# Patient Record
Sex: Male | Born: 1965 | Race: Black or African American | Hispanic: No | Marital: Married | Smoking: Current every day smoker
Health system: Southern US, Community
[De-identification: ages and names within clinical notes are randomized; demographics above are authoritative.]

## PROBLEM LIST (undated history)

## (undated) DIAGNOSIS — M1990 Unspecified osteoarthritis, unspecified site: Secondary | ICD-10-CM

## (undated) DIAGNOSIS — Z720 Tobacco use: Secondary | ICD-10-CM

## (undated) DIAGNOSIS — L309 Dermatitis, unspecified: Secondary | ICD-10-CM

## (undated) DIAGNOSIS — E785 Hyperlipidemia, unspecified: Secondary | ICD-10-CM

## (undated) DIAGNOSIS — L409 Psoriasis, unspecified: Secondary | ICD-10-CM

## (undated) HISTORY — DX: Hyperlipidemia, unspecified: E78.5

## (undated) HISTORY — DX: Unspecified osteoarthritis, unspecified site: M19.90

## (undated) HISTORY — DX: Psoriasis, unspecified: L40.9

## (undated) HISTORY — DX: Tobacco use: Z72.0

---

## 2000-03-19 ENCOUNTER — Emergency Department (HOSPITAL_COMMUNITY): Admission: EM | Admit: 2000-03-19 | Discharge: 2000-03-19 | Payer: Self-pay | Admitting: Emergency Medicine

## 2000-03-21 ENCOUNTER — Emergency Department (HOSPITAL_COMMUNITY): Admission: EM | Admit: 2000-03-21 | Discharge: 2000-03-21 | Payer: Self-pay | Admitting: Emergency Medicine

## 2000-11-28 ENCOUNTER — Emergency Department (HOSPITAL_COMMUNITY): Admission: EM | Admit: 2000-11-28 | Discharge: 2000-11-28 | Payer: Self-pay | Admitting: Emergency Medicine

## 2000-11-28 ENCOUNTER — Encounter: Payer: Self-pay | Admitting: Emergency Medicine

## 2000-12-22 ENCOUNTER — Emergency Department (HOSPITAL_COMMUNITY): Admission: EM | Admit: 2000-12-22 | Discharge: 2000-12-22 | Payer: Self-pay | Admitting: Emergency Medicine

## 2007-06-12 ENCOUNTER — Ambulatory Visit: Payer: Self-pay | Admitting: Internal Medicine

## 2007-06-12 DIAGNOSIS — L408 Other psoriasis: Secondary | ICD-10-CM | POA: Insufficient documentation

## 2007-06-12 LAB — CONVERTED CEMR LAB
Bilirubin Urine: NEGATIVE
Glucose, Urine, Semiquant: 100
Nitrite: NEGATIVE
Protein, U semiquant: NEGATIVE
Specific Gravity, Urine: 1.015
Urobilinogen, UA: 0.2
WBC Urine, dipstick: NEGATIVE
pH: 6

## 2007-06-17 ENCOUNTER — Telehealth: Payer: Self-pay | Admitting: Internal Medicine

## 2007-06-22 LAB — CONVERTED CEMR LAB
ALT: 26 units/L (ref 0–53)
AST: 24 units/L (ref 0–37)
Albumin: 4.1 g/dL (ref 3.5–5.2)
Alkaline Phosphatase: 59 units/L (ref 39–117)
BUN: 10 mg/dL (ref 6–23)
Basophils Absolute: 0 10*3/uL (ref 0.0–0.1)
Basophils Relative: 0.3 % (ref 0.0–1.0)
Bilirubin, Direct: 0.2 mg/dL (ref 0.0–0.3)
CO2: 29 meq/L (ref 19–32)
Calcium: 9.7 mg/dL (ref 8.4–10.5)
Chloride: 102 meq/L (ref 96–112)
Cholesterol: 260 mg/dL (ref 0–200)
Creatinine, Ser: 1.1 mg/dL (ref 0.4–1.5)
Direct LDL: 189.6 mg/dL
Eosinophils Absolute: 0.1 10*3/uL (ref 0.0–0.6)
Eosinophils Relative: 1.7 % (ref 0.0–5.0)
GFR calc Af Amer: 95 mL/min
GFR calc non Af Amer: 79 mL/min
Glucose, Bld: 92 mg/dL (ref 70–99)
HCT: 44 % (ref 39.0–52.0)
HDL: 54.5 mg/dL (ref >39.0)
Hemoglobin: 14.4 g/dL (ref 13.0–17.0)
Lymphocytes Relative: 37.7 % (ref 12.0–46.0)
MCHC: 32.7 g/dL (ref 30.0–36.0)
MCV: 82.9 fL (ref 78.0–100.0)
Monocytes Absolute: 0.7 10*3/uL (ref 0.2–0.7)
Monocytes Relative: 8.4 % (ref 3.0–11.0)
Neutro Abs: 4.7 10*3/uL (ref 1.4–7.7)
Neutrophils Relative %: 51.9 % (ref 43.0–77.0)
PSA: 0.56 ng/mL (ref 0.10–4.00)
Platelets: 185 10*3/uL (ref 150–400)
Potassium: 3.9 meq/L (ref 3.5–5.1)
RBC: 5.3 M/uL (ref 4.22–5.81)
RDW: 14 % (ref 11.5–14.6)
Sodium: 138 meq/L (ref 135–145)
TSH: 2.2 microintl units/mL (ref 0.35–5.50)
Total Bilirubin: 1.1 mg/dL (ref 0.3–1.2)
Total CHOL/HDL Ratio: 4.8
Total Protein: 7 g/dL (ref 6.0–8.3)
Triglycerides: 52 mg/dL (ref 0–149)
VLDL: 10 mg/dL (ref 0–40)
WBC: 8.8 10*3/uL (ref 4.5–10.5)

## 2007-06-23 ENCOUNTER — Telehealth: Payer: Self-pay | Admitting: Internal Medicine

## 2007-07-20 ENCOUNTER — Ambulatory Visit: Payer: Self-pay | Admitting: Internal Medicine

## 2007-07-20 LAB — CONVERTED CEMR LAB
ALT: 28 units/L (ref 0–53)
AST: 26 units/L (ref 0–37)
Albumin: 4.2 g/dL (ref 3.5–5.2)
Alkaline Phosphatase: 68 units/L (ref 39–117)
BUN: 8 mg/dL (ref 6–23)
Basophils Absolute: 0.1 10*3/uL (ref 0.0–0.1)
Basophils Relative: 0.7 % (ref 0.0–1.0)
Bilirubin, Direct: 0.2 mg/dL (ref 0.0–0.3)
CO2: 27 meq/L (ref 19–32)
Calcium: 9.6 mg/dL (ref 8.4–10.5)
Chloride: 105 meq/L (ref 96–112)
Creatinine, Ser: 1.1 mg/dL (ref 0.4–1.5)
Eosinophils Absolute: 0.1 10*3/uL (ref 0.0–0.6)
Eosinophils Relative: 1.4 % (ref 0.0–5.0)
GFR calc Af Amer: 95 mL/min
GFR calc non Af Amer: 79 mL/min
Glucose, Bld: 102 mg/dL — ABNORMAL HIGH (ref 70–99)
HCT: 43.3 % (ref 39.0–52.0)
Hemoglobin: 14.2 g/dL (ref 13.0–17.0)
Lymphocytes Relative: 33.6 % (ref 12.0–46.0)
MCHC: 32.8 g/dL (ref 30.0–36.0)
MCV: 83.2 fL (ref 78.0–100.0)
Monocytes Absolute: 0.7 10*3/uL (ref 0.2–0.7)
Monocytes Relative: 10 % (ref 3.0–11.0)
Neutro Abs: 4 10*3/uL (ref 1.4–7.7)
Neutrophils Relative %: 54.3 % (ref 43.0–77.0)
Platelets: 178 10*3/uL (ref 150–400)
Potassium: 4.3 meq/L (ref 3.5–5.1)
RBC: 5.2 M/uL (ref 4.22–5.81)
RDW: 13.9 % (ref 11.5–14.6)
Sodium: 140 meq/L (ref 135–145)
Total Bilirubin: 0.9 mg/dL (ref 0.3–1.2)
Total Protein: 7.3 g/dL (ref 6.0–8.3)
WBC: 7.4 10*3/uL (ref 4.5–10.5)

## 2008-01-22 ENCOUNTER — Telehealth: Payer: Self-pay | Admitting: Internal Medicine

## 2010-01-09 ENCOUNTER — Ambulatory Visit: Payer: Self-pay | Admitting: Internal Medicine

## 2010-01-09 DIAGNOSIS — L0291 Cutaneous abscess, unspecified: Secondary | ICD-10-CM | POA: Insufficient documentation

## 2010-01-09 DIAGNOSIS — F17200 Nicotine dependence, unspecified, uncomplicated: Secondary | ICD-10-CM | POA: Insufficient documentation

## 2010-01-09 DIAGNOSIS — L0390 Cellulitis, unspecified: Secondary | ICD-10-CM

## 2010-01-10 LAB — CONVERTED CEMR LAB
ALT: 44 units/L (ref 0–53)
AST: 37 units/L (ref 0–37)
Albumin: 4.2 g/dL (ref 3.5–5.2)
Alkaline Phosphatase: 76 units/L (ref 39–117)
BUN: 9 mg/dL (ref 6–23)
Basophils Absolute: 0 10*3/uL (ref 0.0–0.1)
Basophils Relative: 0.5 % (ref 0.0–3.0)
Bilirubin, Direct: 0.1 mg/dL (ref 0.0–0.3)
CO2: 26 meq/L (ref 19–32)
Calcium: 9.6 mg/dL (ref 8.4–10.5)
Chloride: 105 meq/L (ref 96–112)
Cholesterol: 236 mg/dL — ABNORMAL HIGH (ref 0–200)
Creatinine, Ser: 1.1 mg/dL (ref 0.4–1.5)
Direct LDL: 187.9 mg/dL
Eosinophils Absolute: 0.2 10*3/uL (ref 0.0–0.7)
Eosinophils Relative: 2.4 % (ref 0.0–5.0)
GFR calc non Af Amer: 94.8 mL/min (ref >60)
Glucose, Bld: 79 mg/dL (ref 70–99)
HCT: 45.4 % (ref 39.0–52.0)
HDL: 42.7 mg/dL (ref >39.00)
Hemoglobin: 15.1 g/dL (ref 13.0–17.0)
Lymphocytes Relative: 31.8 % (ref 12.0–46.0)
Lymphs Abs: 2.4 10*3/uL (ref 0.7–4.0)
MCHC: 33.3 g/dL (ref 30.0–36.0)
MCV: 83.5 fL (ref 78.0–100.0)
Monocytes Absolute: 0.7 10*3/uL (ref 0.1–1.0)
Monocytes Relative: 9.5 % (ref 3.0–12.0)
Neutro Abs: 4.3 10*3/uL (ref 1.4–7.7)
Neutrophils Relative %: 55.8 % (ref 43.0–77.0)
Platelets: 185 10*3/uL (ref 150.0–400.0)
Potassium: 4.5 meq/L (ref 3.5–5.1)
RBC: 5.44 M/uL (ref 4.22–5.81)
RDW: 14.9 % — ABNORMAL HIGH (ref 11.5–14.6)
Sodium: 140 meq/L (ref 135–145)
TSH: 1.12 microintl units/mL (ref 0.35–5.50)
Total Bilirubin: 0.8 mg/dL (ref 0.3–1.2)
Total CHOL/HDL Ratio: 6
Total Protein: 7.3 g/dL (ref 6.0–8.3)
Triglycerides: 55 mg/dL (ref 0.0–149.0)
VLDL: 11 mg/dL (ref 0.0–40.0)
WBC: 7.7 10*3/uL (ref 4.5–10.5)

## 2010-06-14 NOTE — Assessment & Plan Note (Signed)
Summary: CPX/PT WILL COME IN FASTING/NJR   Vital Signs:  Patient profile:   45 year old male Height:      71.75 inches Weight:      200 pounds BMI:     27.41 Pulse rate:   72 / minute Pulse rhythm:   regular Resp:     12 per minute BP sitting:   114 / 70  (left arm) Cuff size:   regular  Vitals Entered By: Gladis Riffle, RN (January 09, 2010 10:10 AM)  Nutrition Counseling: Patient's BMI is greater than 25 and therefore counseled on weight management options. CC: cpx, fasting Is Patient Diabetic? No   CC:  cpx and fasting.  History of Present Illness: cpx  psoriasis---sees dermatology---taking prescription creams, previously on embrel---stopped 8 months ago due to expense  has noted 3 weeks of enlarged lymph nodes in groin---no known infections. no fever, chills, sweats  has noted a lesion on leg---left thigh---duration several months  All other systems reviewed and were negative     Preventive Screening-Counseling & Management  Alcohol-Tobacco     Alcohol drinks/day: 2     Smoking Status: current     Smoking Cessation Counseling: yes     Packs/Day: 0.75  Current Problems (verified): 1)  Preventive Health Care  (ICD-V70.0) 2)  Psoriasis  (ICD-696.1)  Current Medications (verified): 1)  No Medications  Allergies (verified): No Known Drug Allergies  Past History:  Past Medical History: Last updated: 06/12/2007 psoriasis  Past Surgical History: Last updated: 06/12/2007 Denies surgical history  Family History: Last updated: 06/12/2007 father and mother alive and well  Social History: Last updated: 06/12/2007 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 HEENT exam atraumatic normocephalic symmetric her muscles are intact. Oropharynx is moist. Dentition is poor and he has multiple absent teeth. Neck is supple without lymphadenopathy, Kathreen Cornfield, jugular venous distention or carotid bruits. Chest is clear auscultation without increased work of breathing. Cardiac exam S1 and S2 are normal without murmurs or gallops. Abdominal exam active bowel sounds, soft, nontender there's no post ligamentally. Dermatologic exam he is normal plaques on both lower extremities. It is slightly raised and scaly.  Patient has multiple small boils in the right inguinal area. One or 2 in the left inguinal area. These are somewhat raised, tender, erythematous. Neurologic exam is alert and oriented. Motor and sensory deficits are not observed. Gait is normal.   Impression & Recommendations:  Problem # 1:  PREVENTIVE HEALTH CARE (ICD-V70.0) health maintenance issues are up-to-date. Advised regular exercise. He needs routine dental care. He is considering having all of his teeth extracted and getting dentures. He currently is on amoxicillin. Orders: UA Dipstick w/o Micro (automated)  (81003) Venipuncture ([address]109) TLB-Lipid Panel (80061-LIPID) TLB-BMP (Basic Metabolic Panel-BMET) (80048-METABOL) TLB-CBC Platelet - w/Differential (85025-CBCD) TLB-Hepatic/Liver Function Pnl (80076-HEPATIC) TLB-TSH (Thyroid Stimulating Hormone) (84443-TSH) Specimen Handling ([address])  Problem # 2:  TOBACCO USE (ICD-305.1)  Encouraged smoking cessation and discussed different methods for smoking cessation.   Problem # 3:  PSORIASIS (ICD-696.1) has significant plaque psoriasis on his lower extremities. He will followup with dermatology.  Problem # 4:  CELLULITIS (ICD-682.9)  folliculitis d/c amoxil add doxyciycline  His updated medication list for this problem includes:    Doxycycline Hyclate  100 Mg Caps (Doxycycline hyclate) .Marland Kitchen... Take 1 tab twice a day  Complete  Medication List: 1)  Doxycycline Hyclate 100 Mg Caps (Doxycycline hyclate) .... Take 1 tab twice a day =  Patient Instructions: 1)  Call me in two weeks if the "lymph nodes" are not better---I suspect these are small boils related to ingrown hair follicles Prescriptions: DOXYCYCLINE HYCLATE 100 MG CAPS (DOXYCYCLINE HYCLATE) Take 1 tab twice a day  #20 x 0   Entered and Authorized by:   Birdie Sons MD   Signed by:   Birdie Sons MD on 01/09/2010   Method used:   Electronically to        [facility]. [phone]* (retail)       [address]       [place], [place]  [address]       Ph: [phone]       Fax: [phone]   RxID:   [id number]   Appended Document: CPX/PT WILL COME IN FASTING/NJR  Laboratory Results   Urine Tests    Routine Urinalysis   Color: yellow Appearance: Clear Glucose: negative   (Normal Range: Negative) Bilirubin: negative   (Normal Range: Negative) Ketone: negative   (Normal Range: Negative) Spec. Gravity: 1.010   (Normal Range: 1.003-1.035) Blood: negative   (Normal Range: Negative) pH: 6.5   (Normal Range: 5.0-8.0) Protein: negative   (Normal Range: Negative) Urobilinogen: 0.2   (Normal Range: 0-1) Nitrite: negative   (Normal Range: Negative) Leukocyte Esterace: negative   (Normal Range: Negative)    Comments: Rita Ohara  January 09, 2010 11:29 AM

## 2013-11-01 ENCOUNTER — Ambulatory Visit: Payer: Self-pay | Admitting: Internal Medicine

## 2013-12-10 ENCOUNTER — Encounter: Payer: Self-pay | Admitting: Internal Medicine

## 2013-12-10 ENCOUNTER — Ambulatory Visit (INDEPENDENT_AMBULATORY_CARE_PROVIDER_SITE_OTHER): Payer: BC Managed Care – PPO | Admitting: Internal Medicine

## 2013-12-10 VITALS — BP 116/78 | HR 80 | Temp 98.1°F | Ht 72.0 in | Wt 215.0 lb

## 2013-12-10 DIAGNOSIS — R0989 Other specified symptoms and signs involving the circulatory and respiratory systems: Secondary | ICD-10-CM

## 2013-12-10 DIAGNOSIS — R194 Change in bowel habit: Secondary | ICD-10-CM | POA: Insufficient documentation

## 2013-12-10 DIAGNOSIS — Z0000 Encounter for general adult medical examination without abnormal findings: Secondary | ICD-10-CM | POA: Insufficient documentation

## 2013-12-10 DIAGNOSIS — R0600 Dyspnea, unspecified: Secondary | ICD-10-CM

## 2013-12-10 DIAGNOSIS — R198 Other specified symptoms and signs involving the digestive system and abdomen: Secondary | ICD-10-CM

## 2013-12-10 DIAGNOSIS — L408 Other psoriasis: Secondary | ICD-10-CM

## 2013-12-10 DIAGNOSIS — R0609 Other forms of dyspnea: Secondary | ICD-10-CM

## 2013-12-10 DIAGNOSIS — Z23 Encounter for immunization: Secondary | ICD-10-CM

## 2013-12-10 LAB — POCT URINALYSIS DIPSTICK
Bilirubin, UA: NEGATIVE
Blood, UA: NEGATIVE
Glucose, UA: NEGATIVE
KETONES UA: NEGATIVE
LEUKOCYTES UA: NEGATIVE
Nitrite, UA: NEGATIVE
PROTEIN UA: NEGATIVE
Spec Grav, UA: <=1.005
UROBILINOGEN UA: 0.2
pH, UA: 7

## 2013-12-10 LAB — CBC WITH DIFFERENTIAL/PLATELET
BASOS PCT: 0.4 % (ref 0.0–3.0)
Basophils Absolute: 0 10*3/uL (ref 0.0–0.1)
Eosinophils Absolute: 0.1 10*3/uL (ref 0.0–0.7)
Eosinophils Relative: 1 % (ref 0.0–5.0)
HCT: 45.3 % (ref 39.0–52.0)
HEMOGLOBIN: 14.9 g/dL (ref 13.0–17.0)
Lymphocytes Relative: 25.4 % (ref 12.0–46.0)
Lymphs Abs: 2 10*3/uL (ref 0.7–4.0)
MCHC: 32.8 g/dL (ref 30.0–36.0)
MCV: 83.4 fl (ref 78.0–100.0)
MONOS PCT: 7.6 % (ref 3.0–12.0)
Monocytes Absolute: 0.6 10*3/uL (ref 0.1–1.0)
NEUTROS ABS: 5.1 10*3/uL (ref 1.4–7.7)
Neutrophils Relative %: 65.6 % (ref 43.0–77.0)
Platelets: 196 10*3/uL (ref 150.0–400.0)
RBC: 5.43 Mil/uL (ref 4.22–5.81)
RDW: 15.4 % (ref 11.5–15.5)
WBC: 7.8 10*3/uL (ref 4.0–10.5)

## 2013-12-10 LAB — BASIC METABOLIC PANEL
BUN: 11 mg/dL (ref 6–23)
CHLORIDE: 107 meq/L (ref 96–112)
CO2: 27 mEq/L (ref 19–32)
Calcium: 9.8 mg/dL (ref 8.4–10.5)
Creatinine, Ser: 1.1 mg/dL (ref 0.4–1.5)
GFR: 93.15 mL/min (ref >60.00)
Glucose, Bld: 91 mg/dL (ref 70–99)
Potassium: 4.8 mEq/L (ref 3.5–5.1)
Sodium: 139 mEq/L (ref 135–145)

## 2013-12-10 LAB — LIPID PANEL
Cholesterol: 228 mg/dL — ABNORMAL HIGH (ref 0–200)
HDL: 53.5 mg/dL (ref >39.00)
LDL CALC: 166 mg/dL — AB (ref 0–99)
NonHDL: 174.5
Total CHOL/HDL Ratio: 4
Triglycerides: 41 mg/dL (ref 0.0–149.0)
VLDL: 8.2 mg/dL (ref 0.0–40.0)

## 2013-12-10 LAB — HEPATIC FUNCTION PANEL
ALBUMIN: 4.3 g/dL (ref 3.5–5.2)
ALT: 35 U/L (ref 0–53)
AST: 33 U/L (ref 0–37)
Alkaline Phosphatase: 76 U/L (ref 39–117)
BILIRUBIN DIRECT: 0.1 mg/dL (ref 0.0–0.3)
TOTAL PROTEIN: 7.6 g/dL (ref 6.0–8.3)
Total Bilirubin: 0.4 mg/dL (ref 0.2–1.2)

## 2013-12-10 LAB — PSA: PSA: 0.53 ng/mL (ref 0.10–4.00)

## 2013-12-10 LAB — TSH: TSH: 1.19 u[IU]/mL (ref 0.35–4.50)

## 2013-12-10 NOTE — Progress Notes (Signed)
Subjective:    Patient ID: Roger Fowler, male    DOB: 1966-02-26, 48 y.o.   MRN: [id number]  HPI  48 year old [place] with history of psoriasis and tobacco abuse to establish. Patient has not been seen by PCP since 2009. Patient psoriasis is followed by dermatologist. He reports good response to biologics in the past but he is having difficulty with his insurance company covering cost of biologics. His psoriasis is most problematic in his lower extremities.  Preventative health care-patient still smoking approximately one pack per day. He is approximately 27 year pack year history. He is contemplating tobacco cessation.  Patient reports change in bowel habits. He reports abnormal sensation in his rectal area especially after eating heavy protein meals.  No family hx of colon cancer.  Review of Systems  Constitutional: Negative for activity change, appetite change and unexpected weight change.  Eyes: Negative for visual disturbance.  Respiratory: Negative for cough, chest tightness and shortness of breath.   Cardiovascular: Negative for chest pain.  Genitourinary: Negative for difficulty urinating.  Neurological: Negative for headaches.  Gastrointestinal: Negative for abdominal pain, heartburn melena or hematochezia.  Abnormal rectal sensation after heavy protein meals Psych: Negative for depression or anxiety Endo:  Negative for erectile dysfunction.     Past Medical History  Diagnosis Date  . Psoriasis   . Tobacco use   . Arthritis   . Hyperlipidemia     History   Social History  . Marital Status: Married    Spouse Name: N/A    Number of Children: N/A  . Years of Education: N/A   Occupational History  . Not on file.   Social History Main Topics  . Smoking status: Current Every Day Smoker  . Smokeless tobacco: Not on file  . Alcohol Use: Yes  . Drug Use: No  . Sexual Activity: Not on file   Other Topics Concern  . Not on file   Social History  Narrative   Occupation:  DealerQuality Controller for [employer]   Current Smoker   Divorced   One son age 47 - currently serving in [employer]. Deployed in [place]          History reviewed. No pertinent past surgical history.  Family History  Problem Relation Age of Onset  . Hypertension Mother   . Diabetes Mellitus II Neg Hx   . Colon cancer Neg Hx   . Prostate cancer Neg Hx     No Known Allergies  No current outpatient prescriptions on file prior to visit.   No current facility-administered medications on file prior to visit.    BP 116/78  Pulse 80  Temp(Src) 98.1 F (36.7 C) (Oral)  Ht 6' (1.829 m)  Wt 215 lb (97.523 kg)  BMI 29.15 kg/m2    Objective:   Physical Exam  Constitutional: He is oriented to person, place, and time. He appears well-developed and well-nourished. No distress.  HENT:  Head: Normocephalic and atraumatic.  Right Ear: External ear normal.  Mouth/Throat: Oropharynx is clear and moist. No oropharyngeal exudate.  Upper dental plate  Eyes: Conjunctivae and EOM are normal. Pupils are equal, round, and reactive to light.  Neck: Normal range of motion. Neck supple. No thyromegaly present.  No carotid bruit  Cardiovascular: Normal rate, regular rhythm, normal heart sounds and intact distal pulses.   No murmur heard. Pulmonary/Chest: Effort normal. He has no wheezes. He has no rales.  Slightly distant breath sounds with prolonged expiration  Abdominal:  Soft. He exhibits no distension and no mass. There is no tenderness.  No organomegaly  Musculoskeletal: Normal range of motion. He exhibits no edema.  Lymphadenopathy:    He has no cervical adenopathy.  Neurological: He is alert and oriented to person, place, and time. No cranial nerve deficit.  Skin:  Scattered hyperpigmented plaques especially of lower extremities  Psychiatric: He has a normal mood and affect. His behavior is normal.          Assessment & Plan:

## 2013-12-10 NOTE — Patient Instructions (Signed)
Start over the counter Metamucil supplement as directed. Contact ou office if you would like to enroll in smoking cessation classes.

## 2013-12-10 NOTE — Assessment & Plan Note (Addendum)
Reviewed adult health maintenance protocols.  Patient counseled regarding smoking cessation. He is pre-contemplative. Patient updated with Pneumovax. Obtain screening labs.  EKG is unremarkable.  Spirometry normal but lung age 48.

## 2013-12-10 NOTE — Assessment & Plan Note (Signed)
Followed by dermatology.  Patient reports he is unable to use Humira secondary to excessive cost

## 2013-12-10 NOTE — Assessment & Plan Note (Signed)
Referred to gastroenterologist for colonoscopy.

## 2013-12-16 ENCOUNTER — Encounter: Payer: Self-pay | Admitting: Internal Medicine

## 2013-12-17 ENCOUNTER — Encounter: Payer: Self-pay | Admitting: Internal Medicine

## 2013-12-17 MED ORDER — PRAVASTATIN SODIUM 40 MG PO TABS
40.0000 mg | ORAL_TABLET | Freq: Every day | ORAL | Status: DC
Start: 2013-12-17 — End: 2014-07-19

## 2014-01-13 ENCOUNTER — Encounter: Payer: Self-pay | Admitting: Internal Medicine

## 2014-02-22 ENCOUNTER — Ambulatory Visit: Payer: BC Managed Care – PPO | Admitting: Internal Medicine

## 2014-03-10 ENCOUNTER — Ambulatory Visit: Payer: BC Managed Care – PPO | Admitting: Internal Medicine

## 2014-04-06 ENCOUNTER — Ambulatory Visit: Payer: BC Managed Care – PPO | Admitting: Internal Medicine

## 2014-04-08 ENCOUNTER — Ambulatory Visit: Payer: BC Managed Care – PPO | Admitting: Internal Medicine

## 2014-05-04 ENCOUNTER — Ambulatory Visit: Payer: BC Managed Care – PPO | Admitting: Internal Medicine

## 2014-07-19 ENCOUNTER — Other Ambulatory Visit: Payer: Self-pay | Admitting: Internal Medicine

## 2014-07-21 ENCOUNTER — Telehealth: Payer: Self-pay | Admitting: Internal Medicine

## 2014-07-21 DIAGNOSIS — E785 Hyperlipidemia, unspecified: Secondary | ICD-10-CM

## 2014-07-21 NOTE — Telephone Encounter (Signed)
Pt has ran out of chole med. Can I sch pt for fasting labs prior to med follow up?

## 2014-07-26 NOTE — Telephone Encounter (Signed)
Patient is scheduled   

## 2014-07-26 NOTE — Telephone Encounter (Signed)
lmom for pt to cb

## 2014-07-26 NOTE — Telephone Encounter (Signed)
Future orders placed, please schedule 

## 2014-07-28 ENCOUNTER — Other Ambulatory Visit (INDEPENDENT_AMBULATORY_CARE_PROVIDER_SITE_OTHER): Payer: BLUE CROSS/BLUE SHIELD

## 2014-07-28 DIAGNOSIS — E785 Hyperlipidemia, unspecified: Secondary | ICD-10-CM

## 2014-07-28 LAB — LIPID PANEL
CHOL/HDL RATIO: 3
Cholesterol: 179 mg/dL (ref 0–200)
HDL: 57.4 mg/dL (ref >39.00)
LDL CALC: 109 mg/dL — AB (ref 0–99)
NONHDL: 121.6
Triglycerides: 61 mg/dL (ref 0.0–149.0)
VLDL: 12.2 mg/dL (ref 0.0–40.0)

## 2014-07-28 LAB — HEPATIC FUNCTION PANEL
ALBUMIN: 4.3 g/dL (ref 3.5–5.2)
ALT: 36 U/L (ref 0–53)
AST: 32 U/L (ref 0–37)
Alkaline Phosphatase: 63 U/L (ref 39–117)
BILIRUBIN TOTAL: 0.7 mg/dL (ref 0.2–1.2)
Bilirubin, Direct: 0.1 mg/dL (ref 0.0–0.3)
Total Protein: 7 g/dL (ref 6.0–8.3)

## 2014-07-28 LAB — HIGH SENSITIVITY CRP: CRP, High Sensitivity: 1.8 mg/L (ref 0.000–5.000)

## 2014-08-01 ENCOUNTER — Other Ambulatory Visit: Payer: Self-pay

## 2014-08-02 ENCOUNTER — Telehealth: Payer: Self-pay | Admitting: Internal Medicine

## 2014-08-02 NOTE — Telephone Encounter (Signed)
Pt needs blood work results °

## 2014-08-03 NOTE — Telephone Encounter (Signed)
Patient informed. 

## 2014-08-03 NOTE — Telephone Encounter (Signed)
LFTs normal.  Cholesterol levels improved.  CRP within normal range. Keep taking pravastatin.  ROV in 6 months.  He should sign up for mychart to view actual results.

## 2014-09-09 ENCOUNTER — Ambulatory Visit (INDEPENDENT_AMBULATORY_CARE_PROVIDER_SITE_OTHER): Payer: BLUE CROSS/BLUE SHIELD | Admitting: Adult Health

## 2014-09-09 VITALS — BP 110/80 | Temp 98.3°F | Wt 211.8 lb

## 2014-09-09 DIAGNOSIS — M549 Dorsalgia, unspecified: Secondary | ICD-10-CM | POA: Insufficient documentation

## 2014-09-09 DIAGNOSIS — M542 Cervicalgia: Secondary | ICD-10-CM | POA: Diagnosis not present

## 2014-09-09 DIAGNOSIS — M546 Pain in thoracic spine: Secondary | ICD-10-CM

## 2014-09-09 MED ORDER — METHYLPREDNISOLONE ACETATE 80 MG/ML IJ SUSP
80.0000 mg | Freq: Once | INTRAMUSCULAR | Status: AC
  Administered 2014-09-09: 80 mg via INTRAMUSCULAR

## 2014-09-09 NOTE — Progress Notes (Addendum)
   Subjective:    Patient ID: Roger Fowler K Saha, male    DOB: 1965-11-16, 49 y.o.   MRN: [id number]  HPI  Mr. Roger Fowler presents to the office today for neck, shoulder and upper back pain. He was seen at [facility] on 08/25/2014 for two weeks of shoulder pain. X-rays show diffused degenerative cervical disk disease'. He has a MRI on Friday May 6th.   He was given a six day prednisone dose pak ( which he endorses not having any pain for three days after staring dosepak), Meloxicam and Robaxin and Tramadol for break through pain. He never filled the Robaxin and Ultram.   He would like paperwork filled out for short term disability as well as a referral for orthopedic   Review of Systems  Constitutional: Positive for activity change.  Musculoskeletal: Positive for myalgias, back pain, joint swelling, neck pain and neck stiffness.  All other systems reviewed and are negative.      Objective:   Physical Exam  Constitutional: He is oriented to person, place, and time. He appears well-developed and well-nourished. No distress.  Neck: No thyromegaly present.  Does not have full ROM. Pain in neck with movement up and down and right to left. No bruising, redness or warmth noted.   Musculoskeletal: He exhibits no edema or tenderness.  Partial movement in right arm and left arm.  Neurological: He is alert and oriented to person, place, and time. He exhibits normal muscle tone.  Numbness and tingling in left arm. Sensation in tact to light touch.    Skin: Skin is warm and dry. No rash noted. He is not diaphoretic. No erythema.  Psychiatric: He has a normal mood and affect. His behavior is normal. Judgment and thought content normal.  Vitals reviewed.      Assessment & Plan:  1. Thoracic back pain, unspecified back pain laterality -methylPREDNISolone acetate (DEPO-MEDROL) injection 80 mg; Inject 1 mL (80 mg total) into the muscle once. - His orthopedic physician needs to fill out  paperwork for short term disability.  - Fill prescriptions for Ultram and Robaxin and take as directed.  - Take 600mg  Ibuprofen every 8 hours.  2. Bilateral back pain, unspecified location - methylPREDNISolone acetate (DEPO-MEDROL) injection 80 mg; Inject 1 mL (80 mg total) into the muscle once.  3. Neck pain methylPREDNISolone acetate (DEPO-MEDROL) injection 80 mg; Inject 1 mL (80 mg total) into the muscle once.

## 2014-09-09 NOTE — Patient Instructions (Signed)
It was great meeting you today and I wish you the best of luck.   Get the prescriptions for Tramadol (pain medication) and Robaxin ( muscle relaxer) filled today. Also pick up Ibuprofen at the drug store and take 600mg  every 8 hours. Rest this weekend!  I hope you feel better and please do not hesitate to follow up if needed.

## 2014-09-09 NOTE — Progress Notes (Signed)
Pre visit review using our clinic review tool, if applicable. No additional management support is needed unless otherwise documented below in the visit note. 

## 2014-11-17 ENCOUNTER — Other Ambulatory Visit: Payer: Self-pay | Admitting: Internal Medicine

## 2014-11-22 ENCOUNTER — Telehealth: Payer: Self-pay | Admitting: Internal Medicine

## 2014-11-22 NOTE — Telephone Encounter (Signed)
Pt needs to see Dr. Artist PaisYoo for 6 mo f/u and check lab levels. Appointmentt has been scheduled for 12/05/14 @ 215p, does he need to come in ahead of time to get labs drawn. Also, pt has been without cholesterol medications for 4 days. Can he get a refill to last until he sees the MD? Pt [facility] the [facility] [address]. Please advise.

## 2014-11-24 MED ORDER — PRAVASTATIN SODIUM 40 MG PO TABS: 40.0000 mg | ORAL_TABLET | Freq: Every day | ORAL | Status: DC

## 2014-11-24 NOTE — Telephone Encounter (Signed)
Please call and schedule patient for CPX.  Refill sent.

## 2014-11-24 NOTE — Telephone Encounter (Signed)
Ok to RF pravastatin x 1 # 90.  CPX before OV

## 2014-12-05 ENCOUNTER — Ambulatory Visit: Payer: BLUE CROSS/BLUE SHIELD | Admitting: Internal Medicine

## 2014-12-05 NOTE — Telephone Encounter (Signed)
Pt on the way to work. Would like me to cb at 1:30 tomorrow

## 2015-01-04 NOTE — Telephone Encounter (Signed)
Lm on vm to cb and resc °

## 2015-01-19 ENCOUNTER — Other Ambulatory Visit: Payer: Self-pay | Admitting: Neurosurgery

## 2015-01-19 DIAGNOSIS — M5412 Radiculopathy, cervical region: Secondary | ICD-10-CM

## 2015-06-09 ENCOUNTER — Telehealth: Payer: Self-pay | Admitting: Internal Medicine

## 2015-06-09 MED ORDER — PRAVASTATIN SODIUM 40 MG PO TABS: 40.0000 mg | ORAL_TABLET | Freq: Every day | ORAL | Status: DC

## 2015-06-09 NOTE — Telephone Encounter (Signed)
Refill sent.

## 2015-06-09 NOTE — Telephone Encounter (Signed)
Pt request refill  pravastatin (PRAVACHOL) 40 MG tablet  Can you refill until then and let me know? Thanks! Pt has made appointmentt for CPE on 2/24.  [facility]  Pt  States he has been out because could not get appt. Waiting for dr Artist Pais to return. Ensured pt if Dr Artist Pais is not available he can transfer to another doctor.

## 2015-06-09 NOTE — Telephone Encounter (Signed)
Rx sent 

## 2015-06-09 NOTE — Telephone Encounter (Signed)
Pt can not longer get med from [facility]. Pt call new rx pravastatin 40 mg #90 send to [facility] [address]

## 2015-07-05 ENCOUNTER — Other Ambulatory Visit: Payer: BLUE CROSS/BLUE SHIELD

## 2015-07-07 ENCOUNTER — Encounter: Payer: BLUE CROSS/BLUE SHIELD | Admitting: Internal Medicine

## 2015-07-14 ENCOUNTER — Ambulatory Visit (INDEPENDENT_AMBULATORY_CARE_PROVIDER_SITE_OTHER): Payer: BLUE CROSS/BLUE SHIELD | Admitting: Family Medicine

## 2015-07-14 ENCOUNTER — Encounter: Payer: Self-pay | Admitting: Family Medicine

## 2015-07-14 VITALS — BP 122/90 | HR 86 | Temp 98.0°F | Ht 72.0 in | Wt 206.9 lb

## 2015-07-14 DIAGNOSIS — Z0000 Encounter for general adult medical examination without abnormal findings: Secondary | ICD-10-CM | POA: Diagnosis not present

## 2015-07-14 LAB — HEPATIC FUNCTION PANEL
ALT: 32 U/L (ref 0–53)
AST: 26 U/L (ref 0–37)
Albumin: 4.5 g/dL (ref 3.5–5.2)
Alkaline Phosphatase: 59 U/L (ref 39–117)
BILIRUBIN DIRECT: 0.1 mg/dL (ref 0.0–0.3)
TOTAL PROTEIN: 7.1 g/dL (ref 6.0–8.3)
Total Bilirubin: 0.6 mg/dL (ref 0.2–1.2)

## 2015-07-14 LAB — CBC WITH DIFFERENTIAL/PLATELET
BASOS PCT: 0.5 % (ref 0.0–3.0)
Basophils Absolute: 0 10*3/uL (ref 0.0–0.1)
EOS PCT: 0.7 % (ref 0.0–5.0)
Eosinophils Absolute: 0.1 10*3/uL (ref 0.0–0.7)
HCT: 44.7 % (ref 39.0–52.0)
Hemoglobin: 15 g/dL (ref 13.0–17.0)
LYMPHS ABS: 1.8 10*3/uL (ref 0.7–4.0)
Lymphocytes Relative: 23.5 % (ref 12.0–46.0)
MCHC: 33.5 g/dL (ref 30.0–36.0)
MCV: 81.4 fl (ref 78.0–100.0)
MONO ABS: 0.7 10*3/uL (ref 0.1–1.0)
MONOS PCT: 8.4 % (ref 3.0–12.0)
Neutro Abs: 5.2 10*3/uL (ref 1.4–7.7)
Neutrophils Relative %: 66.9 % (ref 43.0–77.0)
PLATELETS: 203 10*3/uL (ref 150.0–400.0)
RBC: 5.49 Mil/uL (ref 4.22–5.81)
RDW: 15.1 % (ref 11.5–15.5)
WBC: 7.8 10*3/uL (ref 4.0–10.5)

## 2015-07-14 LAB — TSH: TSH: 1.18 u[IU]/mL (ref 0.35–4.50)

## 2015-07-14 LAB — BASIC METABOLIC PANEL
BUN: 13 mg/dL (ref 6–23)
CHLORIDE: 106 meq/L (ref 96–112)
CO2: 26 meq/L (ref 19–32)
Calcium: 9.6 mg/dL (ref 8.4–10.5)
Creatinine, Ser: 0.97 mg/dL (ref 0.40–1.50)
GFR: 105.85 mL/min (ref >60.00)
GLUCOSE: 90 mg/dL (ref 70–99)
POTASSIUM: 4.2 meq/L (ref 3.5–5.1)
SODIUM: 140 meq/L (ref 135–145)

## 2015-07-14 LAB — LIPID PANEL
CHOL/HDL RATIO: 4
Cholesterol: 203 mg/dL — ABNORMAL HIGH (ref 0–200)
HDL: 52.4 mg/dL (ref >39.00)
LDL Cholesterol: 137 mg/dL — ABNORMAL HIGH (ref 0–99)
NonHDL: 150.69
TRIGLYCERIDES: 66 mg/dL (ref 0.0–149.0)
VLDL: 13.2 mg/dL (ref 0.0–40.0)

## 2015-07-14 LAB — PSA: PSA: 0.46 ng/mL (ref 0.10–4.00)

## 2015-07-14 MED ORDER — PANTOPRAZOLE SODIUM 40 MG PO TBEC: 40.0000 mg | DELAYED_RELEASE_TABLET | Freq: Every day | ORAL | Status: DC

## 2015-07-14 NOTE — Patient Instructions (Signed)
Smoking Cessation, Tips for Success If you are ready to quit smoking, congratulations! You have chosen to help yourself be healthier. Cigarettes bring nicotine, tar, carbon monoxide, and other irritants into your body. Your lungs, heart, and blood vessels will be able to work better without these poisons. There are many different ways to quit smoking. Nicotine gum, nicotine patches, a nicotine inhaler, or nicotine nasal spray can help with physical craving. Hypnosis, support groups, and medicines help break the habit of smoking. WHAT THINGS CAN I DO TO MAKE QUITTING EASIER?  Here are some tips to help you quit for good:  Pick a date when you will quit smoking completely. Tell all of your friends and family about your plan to quit on that date.  Do not try to slowly cut down on the number of cigarettes you are smoking. Pick a quit date and quit smoking completely starting on that day.  Throw away all cigarettes.   Clean and remove all ashtrays from your home, work, and car.  On a card, write down your reasons for quitting. Carry the card with you and read it when you get the urge to smoke.  Cleanse your body of nicotine. Drink enough water and fluids to keep your urine clear or pale yellow. Do this after quitting to flush the nicotine from your body.  Learn to predict your moods. Do not let a bad situation be your excuse to have a cigarette. Some situations in your life might tempt you into wanting a cigarette.  Never have "just one" cigarette. It leads to wanting another and another. Remind yourself of your decision to quit.  Change habits associated with smoking. If you smoked while driving or when feeling stressed, try other activities to replace smoking. Stand up when drinking your coffee. Brush your teeth after eating. Sit in a different chair when you read the paper. Avoid alcohol while trying to quit, and try to drink fewer caffeinated beverages. Alcohol and caffeine may urge you to  smoke.  Avoid foods and drinks that can trigger a desire to smoke, such as sugary or spicy foods and alcohol.  Ask people who smoke not to smoke around you.  Have something planned to do right after eating or having a cup of coffee. For example, plan to take a walk or exercise.  Try a relaxation exercise to calm you down and decrease your stress. Remember, you may be tense and nervous for the first 2 weeks after you quit, but this will pass.  Find new activities to keep your hands busy. Play with a pen, coin, or rubber band. Doodle or draw things on paper.  Brush your teeth right after eating. This will help cut down on the craving for the taste of tobacco after meals. You can also try mouthwash.   Use oral substitutes in place of cigarettes. Try using lemon drops, carrots, cinnamon sticks, or chewing gum. Keep them handy so they are available when you have the urge to smoke.  When you have the urge to smoke, try deep breathing.  Designate your home as a nonsmoking area.  If you are a heavy smoker, ask your health care provider about a prescription for nicotine chewing gum. It can ease your withdrawal from nicotine.  Reward yourself. Set aside the cigarette money you save and buy yourself something nice.  Look for support from others. Join a support group or smoking cessation program. Ask someone at home or at work to help you with your plan   to quit smoking.  Always ask yourself, "Do I need this cigarette or is this just a reflex?" Tell yourself, "Today, I choose not to smoke," or "I do not want to smoke." You are reminding yourself of your decision to quit.  Do not replace cigarette smoking with electronic cigarettes (commonly called e-cigarettes). The safety of e-cigarettes is unknown, and some may contain harmful chemicals.  If you relapse, do not give up! Plan ahead and think about what you will do the next time you get the urge to smoke. HOW WILL I FEEL WHEN I QUIT SMOKING? You  may have symptoms of withdrawal because your body is used to nicotine (the addictive substance in cigarettes). You may crave cigarettes, be irritable, feel very hungry, cough often, get headaches, or have difficulty concentrating. The withdrawal symptoms are only temporary. They are strongest when you first quit but will go away within 10-14 days. When withdrawal symptoms occur, stay in control. Think about your reasons for quitting. Remind yourself that these are signs that your body is healing and getting used to being without cigarettes. Remember that withdrawal symptoms are easier to treat than the major diseases that smoking can cause.  Even after the withdrawal is over, expect periodic urges to smoke. However, these cravings are generally short lived and will go away whether you smoke or not. Do not smoke! WHAT RESOURCES ARE AVAILABLE TO HELP ME QUIT SMOKING? Your health care provider can direct you to community resources or hospitals for support, which may include:  Group support.  Education.  Hypnosis.  Therapy.   This information is not intended to replace advice given to you by your health care provider. Make sure you discuss any questions you have with your health care provider.   Document Released: 01/26/2004 Document Revised: 05/20/2014 Document Reviewed: 10/15/2012 Elsevier Interactive Patient Education 2016 Elsevier Inc.  

## 2015-07-14 NOTE — Progress Notes (Signed)
Pre visit review using our clinic review tool, if applicable. No additional management support is needed unless otherwise documented below in the visit note. 

## 2015-07-14 NOTE — Progress Notes (Signed)
Subjective:    Patient ID: Roger Fowler, male    DOB: Mar 29, 1966, 50 y.o.   MRN: [id number]  HPI  Patient is here for physical exam  Hyperlipidemia treated with pravastatin.  Ongoing nicotine use. In process of trying to quit. Currently smokes about 8 cigarettes per day.  He's had some chronic neck pains which are relatively stable. Also has history of psoriasis.   He was recently been taking lots of Motrin for his neck pain and has recently had some epigastric pains. No melena. He has stopped using Motrin over the past few days. Food relieves his epigastric pain. No associated nausea or vomiting. No prior history of ulcer. No appetite or weight changes.  No dysphagia  Past Medical History  Diagnosis Date  . Psoriasis   . Tobacco use   . Arthritis   . Hyperlipidemia    No past surgical history on file.  reports that he has been smoking Cigarettes.  He has a 27 pack-year smoking history. He does not have any smokeless tobacco history on file. He reports that he drinks alcohol. He reports that he does not use illicit drugs. family history includes Hypertension in his brother and mother. There is no history of Diabetes Mellitus II, Colon cancer, or Prostate cancer. No Known Allergies    Review of Systems  Constitutional: Negative for fever, activity change, appetite change, fatigue and unexpected weight change.  HENT: Negative for congestion, ear pain and trouble swallowing.   Eyes: Negative for pain and visual disturbance.  Respiratory: Negative for cough, shortness of breath and wheezing.   Cardiovascular: Negative for chest pain and palpitations.  Gastrointestinal: Positive for abdominal pain. Negative for nausea, vomiting, diarrhea, constipation, blood in stool, abdominal distention, anal bleeding and rectal pain.  Genitourinary: Negative for dysuria, hematuria and testicular pain.  Musculoskeletal: Negative for joint swelling and arthralgias.  Skin: Negative for rash.    Neurological: Negative for dizziness, syncope and headaches.  Hematological: Negative for adenopathy.  Psychiatric/Behavioral: Negative for confusion and dysphoric mood.       Objective:   Physical Exam  Constitutional: He is oriented to person, place, and time. He appears well-developed and well-nourished. No distress.  HENT:  Head: Normocephalic and atraumatic.  Right Ear: External ear normal.  Left Ear: External ear normal.  Mouth/Throat: Oropharynx is clear and moist.  Eyes: Conjunctivae and EOM are normal. Pupils are equal, round, and reactive to light.  Neck: Normal range of motion. Neck supple. No thyromegaly present.  Cardiovascular: Normal rate, regular rhythm and normal heart sounds.   No murmur heard. Pulmonary/Chest: No respiratory distress. He has no wheezes. He has no rales.  Abdominal: Soft. Bowel sounds are normal. He exhibits no distension and no mass. There is no tenderness. There is no rebound and no guarding.  No reproducible tenderness at this time  Musculoskeletal: He exhibits no edema.  Lymphadenopathy:    He has no cervical adenopathy.  Neurological: He is alert and oriented to person, place, and time. He displays normal reflexes. No cranial nerve deficit.  Skin: No rash noted.  Psychiatric: He has a normal mood and affect.          Assessment & Plan:   Physical exam. Screening lab work obtained. He is encouraged quit smoking altogether. Handout given. We discussed healthy lifestyle habits including more consistent exercise. Regarding his recent epigastric pain suspect he may have some gastritis from recent Motrin use. Leave off all nonsteroidals. Consider short-term use of Protonix 40 mg  once daily. Touch base if symptoms not resolving over the next few weeks.

## 2015-07-20 ENCOUNTER — Telehealth: Payer: Self-pay | Admitting: Internal Medicine

## 2015-07-20 NOTE — Telephone Encounter (Signed)
Pt would like you to call him to go over lab results. Pt saw Dr Caryl NeverBurchette last week.

## 2015-07-20 NOTE — Telephone Encounter (Signed)
Pt is aware of results. 

## 2015-09-04 ENCOUNTER — Other Ambulatory Visit: Payer: Self-pay | Admitting: Internal Medicine

## 2015-10-13 ENCOUNTER — Other Ambulatory Visit: Payer: Self-pay | Admitting: Internal Medicine

## 2015-10-13 MED ORDER — PRAVASTATIN SODIUM 40 MG PO TABS: ORAL_TABLET | ORAL | Status: DC

## 2015-10-13 NOTE — Telephone Encounter (Signed)
Pt notified Rx sent to pharmacy. Pt verbalized understanding. 

## 2015-10-13 NOTE — Telephone Encounter (Signed)
Pt need new Rx for pravastatin    Pharm:  [facility] [address]

## 2015-10-23 ENCOUNTER — Ambulatory Visit (HOSPITAL_COMMUNITY)
Admission: EM | Admit: 2015-10-23 | Discharge: 2015-10-23 | Disposition: A | Discharge status: Home / Self Care | Payer: BLUE CROSS/BLUE SHIELD | Attending: Family Medicine | Admitting: Family Medicine

## 2015-10-23 ENCOUNTER — Encounter (HOSPITAL_COMMUNITY): Payer: Self-pay | Admitting: Emergency Medicine

## 2015-10-23 DIAGNOSIS — F419 Anxiety disorder, unspecified: Secondary | ICD-10-CM | POA: Diagnosis not present

## 2015-10-23 MED ORDER — TRAZODONE HCL 100 MG PO TABS: 100.0000 mg | ORAL_TABLET | Freq: Every day | ORAL | Status: DC

## 2015-10-23 NOTE — Discharge Instructions (Signed)
Panic Attacks Panic attacks are sudden, short feelings of great fear or discomfort. You may have them for no reason when you are relaxed, when you are uneasy (anxious), or when you are sleeping.  HOME CARE  Take all your medicines as told.  Check with your doctor before starting new medicines.  Keep all doctor visits. GET HELP IF:  You are not able to take your medicines as told.  Your symptoms do not get better.  Your symptoms get worse. GET HELP RIGHT AWAY IF:  Your attacks seem different than your normal attacks.  You have thoughts about hurting yourself or others.  You take panic attack medicine and you have a side effect. MAKE SURE YOU:  Understand these instructions.  Will watch your condition.  Will get help right away if you are not doing well or get worse.   This information is not intended to replace advice given to you by your health care provider. Make sure you discuss any questions you have with your health care provider.   Document Released: 06/01/2010 Document Revised: 02/17/2013 Document Reviewed: 12/11/2012 Elsevier Interactive Patient Education 2016 Elsevier Inc.  - Generalized Anxiety Disorder Generalized anxiety disorder (GAD) is a mental disorder. It interferes with life functions, including relationships, work, and school. GAD is different from normal anxiety, which everyone experiences at some point in their lives in response to specific life events and activities. Normal anxiety actually helps us prepare for and get through these life events and activities. Normal anxiety goes away after the event or activity is over.  GAD causes anxiety that is not necessarily related to specific events or activities. It also causes excess anxiety in proportion to specific events or activities. The anxiety associated with GAD is also difficult to control. GAD can vary from mild to severe. People with severe GAD can have intense waves of anxiety with physical symptoms  (panic attacks).  SYMPTOMS The anxiety and worry associated with GAD are difficult to control. This anxiety and worry are related to many life events and activities and also occur more days than not for 6 months or longer. People with GAD also have three or more of the following symptoms (one or more in children):  Restlessness.   Fatigue.  Difficulty concentrating.   Irritability.  Muscle tension.  Difficulty sleeping or unsatisfying sleep. DIAGNOSIS GAD is diagnosed through an assessment by your health care provider. Your health care provider will ask you questions aboutyour mood,physical symptoms, and events in your life. Your health care provider may ask you about your medical history and use of alcohol or drugs, including prescription medicines. Your health care provider may also do a physical exam and blood tests. Certain medical conditions and the use of certain substances can cause symptoms similar to those associated with GAD. Your health care provider may refer you to a mental health specialist for further evaluation. TREATMENT The following therapies are usually used to treat GAD:   Medication. Antidepressant medication usually is prescribed for long-term daily control. Antianxiety medicines may be added in severe cases, especially when panic attacks occur.   Talk therapy (psychotherapy). Certain types of talk therapy can be helpful in treating GAD by providing support, education, and guidance. A form of talk therapy called cognitive behavioral therapy can teach you healthy ways to think about and react to daily life events and activities.  Stress managementtechniques. These include yoga, meditation, and exercise and can be very helpful when they are practiced regularly. A mental health specialist can   help determine which treatment is best for you. Some people see improvement with one therapy. However, other people require a combination of therapies.   This information is  not intended to replace advice given to you by your health care provider. Make sure you discuss any questions you have with your health care provider.   Document Released: 08/24/2012 Document Revised: 05/20/2014 Document Reviewed: 08/24/2012 Elsevier Interactive Patient Education 2016 Elsevier Inc.   

## 2015-10-23 NOTE — ED Notes (Signed)
Patient got up around noon today (usual).  Patient went to work around 3:00 pm today (usual).  Patient reports when arriving to work and still in the parking lot had sudden onset of feel unbearably hot, "overheated..hyperventilating..weak..difficulty breathing".  Patient now feels exhausted, tired and sleepy.  Denies any pain at any point today. Reports breathing is more normal.  Patient has not eaten today since getting up at noon (usual routine) Patient volunteered that he is under a lot of stress recently.  Mother diagnosed with cancer 2 months ago and he currently takes her to treatment twice a week.  Patient reports he is not resting well

## 2015-10-23 NOTE — ED Provider Notes (Signed)
CSN: [id number]     Arrival date & time 10/23/15  1648 History   First MD Initiated Contact with Patient 10/23/15 1728     Chief Complaint  Patient presents with  . Weakness   (Consider location/radiation/quality/duration/timing/severity/associated sxs/prior Treatment) HPI History obtained from patient:  Pt presents with the cc of:  Sense that he was going to faint Duration of symptoms: building up over the last couple of months. Treatment prior to arrival: none Context: Has some chronic pain issues that he is dealing with, mother was recently dx with cancer and needs treatments twice weekly. Not sleeping well, about 4 hours a night even on weekends. States he feels he needs to offload some of this stress. Felt well getting up today, but when he got to work suddenly felt as if he wanted to download all of his problems. Does not feel suicidal or homicidal.   Pain score: 3 FAMILY HISTORY: HTN-mother    Past Medical History  Diagnosis Date  . Psoriasis   . Tobacco use   . Arthritis   . Hyperlipidemia    History reviewed. No pertinent past surgical history. Family History  Problem Relation Age of Onset  . Hypertension Mother   . Cancer Mother   . Diabetes Mellitus II Neg Hx   . Colon cancer Neg Hx   . Prostate cancer Neg Hx   . Hypertension Brother    Social History  Substance Use Topics  . Smoking status: Current Every Day Smoker -- 1.00 packs/day for 27 years    Types: Cigarettes  . Smokeless tobacco: None  . Alcohol Use: Yes    Review of Systems  Denies: HEADACHE, NAUSEA, ABDOMINAL PAIN, CHEST PAIN, CONGESTION, DYSURIA, SHORTNESS OF BREATH  Allergies  Review of patient's allergies indicates no known allergies.  Home Medications   Prior to Admission medications   Medication Sig Start Date End Date Taking? Authorizing Provider  OVER THE COUNTER MEDICATION Muscle relaxer and pain medicine   Yes Historical Provider, MD  pantoprazole (PROTONIX) 40 MG tablet Take 1  tablet (40 mg total) by mouth daily. 07/14/15   Kristian CoveyBruce W Burchette, MD  pravastatin (PRAVACHOL) 40 MG tablet TAKE 1 TABLET (40 MG TOTAL) BY MOUTH DAILY. 10/13/15   Kristian CoveyBruce W Burchette, MD  traZODone (DESYREL) 100 MG tablet Take 1 tablet (100 mg total) by mouth at bedtime. 10/23/15   Tharon AquasFrank C Patrick, PA   Meds Ordered and Administered this Visit  Medications - No data to display  BP 157/92 mmHg  Pulse 72  Temp(Src) 98 F (36.7 C) (Oral)  SpO2 100% No data found.   Physical Exam NURSES NOTES AND VITAL SIGNS REVIEWED. CONSTITUTIONAL: Well developed, well nourished, no acute distress HEENT: normocephalic, atraumatic EYES: Conjunctiva normal NECK:normal ROM, supple, no adenopathy PULMONARY:No respiratory distress, normal effort ABDOMINAL: Soft, ND, NT BS+, No CVAT MUSCULOSKELETAL: Normal ROM of all extremities,  SKIN: warm and dry without rash PSYCHIATRIC: Mood and affect appear a bit depressed, anxious, crying.   ED Course  ED EKG  Date/Time: 10/23/2015 6:06 PM Performed by: Tharon AquasPATRICK, FRANK C Authorized by: Tharon AquasPATRICK, FRANK C Interpreted by ED physician: FP, PAC. Comparison: not compared with previous ECG  Previous ECG: no previous ECG available Rhythm: sinus rhythm Rate: normal QRS axis: left Conduction: conduction normal ST Segments: ST segments normal T Waves: T waves normal Other: no other findings Clinical impression: normal ECG Comments: With criteria of LAD.    (including critical care time)  Labs Review Labs Reviewed - No data  to display  Imaging Review No results found.   Visual Acuity Review  Right Eye Distance:   Left Eye Distance:   Bilateral Distance:    Right Eye Near:   Left Eye Near:    Bilateral Near:    <ECGINTERP>     RX trazodone may help with sleep. Pt needs to see therapist to help sort anxiety issues.   MDM   1. Anxiety    Pt has a great deal of stress in his life at this time without any help. He is not resting well, smoking more,  having more pain in his shoulder/neck.    Tharon Aquas, PA 10/23/15 [phone]

## 2016-04-07 ENCOUNTER — Other Ambulatory Visit: Payer: Self-pay | Admitting: Family Medicine

## 2016-04-30 ENCOUNTER — Encounter: Payer: Self-pay | Admitting: Family Medicine

## 2016-04-30 ENCOUNTER — Ambulatory Visit (INDEPENDENT_AMBULATORY_CARE_PROVIDER_SITE_OTHER): Payer: BLUE CROSS/BLUE SHIELD | Admitting: Family Medicine

## 2016-04-30 ENCOUNTER — Ambulatory Visit (INDEPENDENT_AMBULATORY_CARE_PROVIDER_SITE_OTHER)
Admission: RE | Admit: 2016-04-30 | Discharge: 2016-04-30 | Disposition: A | Discharge status: Home / Self Care | Payer: BLUE CROSS/BLUE SHIELD | Source: Ambulatory Visit | Attending: Family Medicine | Admitting: Family Medicine

## 2016-04-30 VITALS — BP 130/80 | HR 77 | Resp 12 | Ht 72.0 in | Wt 208.5 lb

## 2016-04-30 DIAGNOSIS — R55 Syncope and collapse: Secondary | ICD-10-CM | POA: Diagnosis not present

## 2016-04-30 DIAGNOSIS — R0789 Other chest pain: Secondary | ICD-10-CM | POA: Diagnosis not present

## 2016-04-30 DIAGNOSIS — H8110 Benign paroxysmal vertigo, unspecified ear: Secondary | ICD-10-CM | POA: Diagnosis not present

## 2016-04-30 NOTE — Progress Notes (Signed)
Pre visit review using our clinic review tool, if applicable. No additional management support is needed unless otherwise documented below in the visit note. 

## 2016-04-30 NOTE — Progress Notes (Signed)
HPI:  ACUTE VISIT:  Chief Complaint  Patient presents with  . hurt chest    [name] is a 50 y.o. male, who is here today complaining of chest wall pain after fall 2 weeks ago.  He states that he was in his kitchen on "Sunday 2 weeks ago", "fainted", reportingLOC, ? Couple minutes,states that it took it a few seconds to realize where he was and what happened. Episode happened just after eating, he states that he felt "funny", spinning sensation and nauseated, then he passed out. He deneis hearing loss or tinnitus. Denies tongue bitten, urine or bowel incontinence. He did not seek medical attention after fall. Pain now localized on LUQ of abdomen and left chest wall. Pain states about a week after fall, no ecchymosis noted. Pain is exacerbated by picking up , achy/sharp 8-10/10. Alleviated by rest,shallow breathing, and Ibuprofen.  -Denies severe/frequent headache, visual changes, chest pain, dyspnea, palpitation, claudication, focal weakness, or edema.  He has had prior episodes of dizziness, attributed to dealing with stress, mother Dx with cancer.  States that summer this year he almost pass out, he was  "hyperventilating" and feeling dizzy. He was evaluated in the  ER and according to pt, he was Dx with "panic attack."   Also c/o feeling "tired."  He was on Trazodone in the past , mainly to help with insomnia. He tells me that he received a few tabs after ER discharge. Insomnia has improved but still "sometimes" he has trouble sleeping.    Review of Systems  Constitutional: Positive for fatigue. Negative for appetite change, diaphoresis, fever and unexpected weight change.  HENT: Negative for nosebleeds, sore throat and trouble swallowing.   Eyes: Negative for pain and visual disturbance.  Respiratory: Negative for cough, shortness of breath and wheezing.   Cardiovascular: Negative for palpitations and leg swelling.  Gastrointestinal: Negative for abdominal  pain, nausea and vomiting.       No changes on bowel habits.  Genitourinary: Negative for decreased urine volume and hematuria.  Musculoskeletal: Negative for back pain and neck pain.  Skin: Negative for color change and rash.  Neurological: Positive for syncope. Negative for seizures, weakness and headaches.  Psychiatric/Behavioral: Positive for sleep disturbance. Negative for confusion. The patient is nervous/anxious.       Current Outpatient Prescriptions on File Prior to Visit  Medication Sig Dispense Refill  . OVER THE COUNTER MEDICATION Muscle relaxer and pain medicine    . pantoprazole (PROTONIX) 40 MG tablet TAKE 1 TABLET BY MOUTH EVERY DAY 90 tablet 3  . pravastatin (PRAVACHOL) 40 MG tablet TAKE 1 TABLET (40 MG TOTAL) BY MOUTH DAILY. 90 tablet 1   No current facility-administered medications on file prior to visit.      Past Medical History:  Diagnosis Date  . Arthritis   . Hyperlipidemia   . Psoriasis   . Tobacco use    No Known Allergies  Social History   Social History  . Marital status: Married    Spouse name: N/A  . Number of children: N/A  . Years of education: N/A   Social History Main Topics  . Smoking status: Current Every Day Smoker    Packs/day: 1.00    Years: 27.00    Types: Cigarettes  . Smokeless tobacco: None  . Alcohol use Yes  . Drug use: No  . Sexual activity: Not Asked   Other Topics Concern  . None   Social History Narrative   Occupation:  [employer] for [employer]   Current Smoker   Divorced   One son age 49 - currently serving in [employer]. Deployed in [place]          Vitals:   04/30/16 1427  BP: 130/80  Pulse: 77  Resp: 12    O2 sat at RA 98%.  Body mass index is 28.28 kg/m.    Physical Exam  Nursing note and vitals reviewed. Constitutional: He is oriented to person, place, and time. He appears well-developed. No distress.  HENT:  Head: Atraumatic.  Mouth/Throat: Oropharynx is clear and moist and  mucous membranes are normal.  Eyes: Conjunctivae and EOM are normal.  Neck: No JVD present. Carotid bruit is not present. No thyroid mass and no thyromegaly (palpable) present.  Cardiovascular: Normal rate and regular rhythm.   Respiratory: Effort normal and breath sounds normal. No respiratory distress.  GI: Soft. He exhibits no mass. There is no tenderness.  Musculoskeletal: He exhibits no edema.  Tenderness upon palpation of left upper chest wall, along anterior axillary line. No deformity or crepitus. Pain exacerbated with movement on examination table. No pain or deformity appreciated on left rib cage.  Lymphadenopathy:    He has no cervical adenopathy.  Neurological: He is alert and oriented to person, place, and time. He has normal strength. No cranial nerve deficit. Coordination and gait normal.  Pronator drift negative  Skin: Skin is warm. No ecchymosis and no rash noted. No erythema.  Psychiatric: His mood appears anxious.  Well groomed, good eye contact.      ASSESSMENT AND PLAN:     [name] was seen today for hurt chest.  Diagnoses and all orders for this visit:  Syncope and collapse  Single episode. We discussed possible causes, examination today otherwise benign.If he has more episodes further work-up will be necessary.  Instructed about warning signs.  -     Basic metabolic panel  Benign paroxysmal vertigo, unspecified laterality  Provided Hx suggest vertigo, which could have cause fall. We discussed physiopathology and treatment options , he is not currently symptomatic. F/U as needed.  Left-sided chest wall pain  Seems musculoskeletal soft tissue trauma. Rib imaging ordered today, explained treatment in case rib fracture as well as possible complications. Further recommendations will be given according to imaging report. Explained that pain may last a few weeks. Avoid shallow breathing. Continue OTC Ibuprofen 400 mg tid as needed for pain. F/U as  needed.   -     DG Ribs Unilateral Left; Future     Return if symptoms worsen or fail to improve, for 07/2016 for routine.     -[name] was advised to return or notify a doctor immediately if symptoms worsen or persist or new concerns arise; otherwise he can follow on his chronic medical problems (HLD,GERD)+ routine in 07/2016.       Myrtice Lowdermilk G. [place]Jordan, MD  [facility]. [place] office.

## 2016-04-30 NOTE — Patient Instructions (Signed)
A few things to remember from today's visit:   Syncope and collapse - Plan: Basic metabolic panel  Benign paroxysmal vertigo, unspecified laterality  Left-sided chest wall pain - Plan: DG Ribs Unilateral Left   We have ordered labs or studies at this visit.  It can take up to 1-2 weeks for results and processing. IF results require follow up or explanation, we will call you with instructions. Clinically stable results will be released to your [facility]. If you have not heard from us or cannot find your results in [facility] in 2 weeks please contact our office at [phone].  If you are not yet signed up for [facility], please consider signing up  Please be sure medication list is accurate. If a new problem present, please set up appointment sooner than planned today.

## 2016-05-01 ENCOUNTER — Encounter: Payer: Self-pay | Admitting: Family Medicine

## 2016-05-01 LAB — BASIC METABOLIC PANEL
BUN: 10 mg/dL (ref 6–23)
CALCIUM: 9.4 mg/dL (ref 8.4–10.5)
CO2: 27 meq/L (ref 19–32)
Chloride: 105 mEq/L (ref 96–112)
Creatinine, Ser: 1.01 mg/dL (ref 0.40–1.50)
GFR: 100.7 mL/min (ref >60.00)
GLUCOSE: 85 mg/dL (ref 70–99)
Potassium: 4.3 mEq/L (ref 3.5–5.1)
SODIUM: 140 meq/L (ref 135–145)

## 2016-05-02 ENCOUNTER — Telehealth: Payer: Self-pay | Admitting: Family Medicine

## 2016-05-02 NOTE — Telephone Encounter (Signed)
Patient Name: Roger Fowler DOB: 1966-04-08 Initial Comment caller states he is having flank pain Nurse Assessment Nurse: Elijah Birkaldwell, RN, Stark BrayLynda Date/Time (Eastern Time): 05/02/2016 12:15:39 PM Confirm and document reason for call. If symptomatic, describe symptoms. ---Caller states he is having left side pain, chest area/upper ribs. Took x rays Tuesday, saw his Dr. No fracture, kidney lab work is good, no rx. Pain is an 8-9/10. Has not had any feedback. No fever. Hurts when he moves/sleeps. Fell in house 2 weeks ago, symptoms started a week later. Does the patient have any new or worsening symptoms? ---Yes Will a triage be completed? ---Yes Related visit to physician within the last 2 weeks? ---Yes Does the PT have any chronic conditions? (i.e. diabetes, asthma, etc.) ---Yes List chronic conditions. ---cholesterol Is this a behavioral health or substance abuse call? ---No Guidelines Guideline Title Affirmed Question Affirmed Notes Chest Injury [1] After 72 hours AND [2] chest pain not improving Final Disposition User See PCP When Office is Open (within 3 days) Elijah Birkaldwell, RN, [employer] HotelsLynda Comments Caller is not understanding why he did not receive any follow-up regarding his checkup with rib/chest injury. He had to look at My Chart for results of x ray, etc. Nurse gave exit care advice, asked if patient wanted to be seen again, but patient would just like a call about what else needs to be done. He can be reached at this #. Disagree/Comply: Comply

## 2016-05-02 NOTE — Telephone Encounter (Signed)
I saw Mr Roger Fowler for an acute visit c/o chest wall pain after a fall he suffered about 2 weeks ago. Explained that seems to be soft tissue trauma, plain imaging did not report rib fracture or another abnormality. Follow up for this recommended as needed.  -He also mentioned that fall was due to passing out, after episode of spinning sensation, not recurrent problems. Ordered a BMP, which was reviewed and sent results through My Chart. Recommended following if another episode.  Thanks, Betty Swazil[place]Jordan, MD

## 2016-05-02 NOTE — Telephone Encounter (Signed)
Pt is still having flank pain from fall 2 weeks ago.   Dr. SwazilandJordan - Please advise. Thanks!

## 2016-05-03 NOTE — Telephone Encounter (Signed)
Called patient to see if patient wanted to make a follow up appointment but their was no answer so I left a voicemail for patient to return office call

## 2016-05-03 NOTE — Telephone Encounter (Signed)
Pt refused to set-up follow-up appointment he state that he was not given any medication and if he thinks that if he needs to be seen he will seek care elsewhere.

## 2017-07-17 ENCOUNTER — Ambulatory Visit: Payer: Managed Care, Other (non HMO) | Admitting: Family Medicine

## 2017-07-17 ENCOUNTER — Encounter: Payer: Self-pay | Admitting: Family Medicine

## 2017-07-17 VITALS — BP 128/86 | HR 86 | Temp 97.9°F | Ht 72.0 in | Wt 204.0 lb

## 2017-07-17 DIAGNOSIS — Z0001 Encounter for general adult medical examination with abnormal findings: Secondary | ICD-10-CM | POA: Diagnosis not present

## 2017-07-17 DIAGNOSIS — Z1211 Encounter for screening for malignant neoplasm of colon: Secondary | ICD-10-CM

## 2017-07-17 DIAGNOSIS — F17210 Nicotine dependence, cigarettes, uncomplicated: Secondary | ICD-10-CM | POA: Diagnosis not present

## 2017-07-17 DIAGNOSIS — Z13220 Encounter for screening for lipoid disorders: Secondary | ICD-10-CM

## 2017-07-17 DIAGNOSIS — Z23 Encounter for immunization: Secondary | ICD-10-CM

## 2017-07-17 DIAGNOSIS — Z131 Encounter for screening for diabetes mellitus: Secondary | ICD-10-CM | POA: Diagnosis not present

## 2017-07-17 DIAGNOSIS — F419 Anxiety disorder, unspecified: Secondary | ICD-10-CM

## 2017-07-17 DIAGNOSIS — Z1329 Encounter for screening for other suspected endocrine disorder: Secondary | ICD-10-CM | POA: Diagnosis not present

## 2017-07-17 DIAGNOSIS — Z0000 Encounter for general adult medical examination without abnormal findings: Secondary | ICD-10-CM

## 2017-07-17 LAB — COMPREHENSIVE METABOLIC PANEL
ALBUMIN: 4.4 g/dL (ref 3.5–5.2)
ALK PHOS: 66 U/L (ref 39–117)
ALT: 20 U/L (ref 0–53)
AST: 23 U/L (ref 0–37)
BUN: 9 mg/dL (ref 6–23)
CALCIUM: 10 mg/dL (ref 8.4–10.5)
CO2: 28 mEq/L (ref 19–32)
CREATININE: 0.95 mg/dL (ref 0.40–1.50)
Chloride: 104 mEq/L (ref 96–112)
GFR: 107.54 mL/min (ref >60.00)
Glucose, Bld: 95 mg/dL (ref 70–99)
Potassium: 4.3 mEq/L (ref 3.5–5.1)
SODIUM: 137 meq/L (ref 135–145)
Total Bilirubin: 0.6 mg/dL (ref 0.2–1.2)
Total Protein: 7.1 g/dL (ref 6.0–8.3)

## 2017-07-17 LAB — TSH: TSH: 0.82 u[IU]/mL (ref 0.35–4.50)

## 2017-07-17 LAB — CBC WITH DIFFERENTIAL/PLATELET
BASOS ABS: 0 10*3/uL (ref 0.0–0.1)
Basophils Relative: 0.6 % (ref 0.0–3.0)
EOS PCT: 0.4 % (ref 0.0–5.0)
Eosinophils Absolute: 0 10*3/uL (ref 0.0–0.7)
HCT: 45.2 % (ref 39.0–52.0)
HEMOGLOBIN: 15 g/dL (ref 13.0–17.0)
Lymphocytes Relative: 18.4 % (ref 12.0–46.0)
Lymphs Abs: 1.4 10*3/uL (ref 0.7–4.0)
MCHC: 33.2 g/dL (ref 30.0–36.0)
MCV: 83.3 fl (ref 78.0–100.0)
MONOS PCT: 7.7 % (ref 3.0–12.0)
Monocytes Absolute: 0.6 10*3/uL (ref 0.1–1.0)
Neutro Abs: 5.5 10*3/uL (ref 1.4–7.7)
Neutrophils Relative %: 72.9 % (ref 43.0–77.0)
Platelets: 194 10*3/uL (ref 150.0–400.0)
RBC: 5.43 Mil/uL (ref 4.22–5.81)
RDW: 15.7 % — ABNORMAL HIGH (ref 11.5–15.5)
WBC: 7.6 10*3/uL (ref 4.0–10.5)

## 2017-07-17 LAB — LIPID PANEL
CHOLESTEROL: 202 mg/dL — AB (ref 0–200)
HDL: 74.2 mg/dL (ref >39.00)
LDL Cholesterol: 116 mg/dL — ABNORMAL HIGH (ref 0–99)
NonHDL: 127.83
TRIGLYCERIDES: 57 mg/dL (ref 0.0–149.0)
Total CHOL/HDL Ratio: 3
VLDL: 11.4 mg/dL (ref 0.0–40.0)

## 2017-07-17 LAB — HEMOGLOBIN A1C: HEMOGLOBIN A1C: 5.6 % (ref 4.6–6.5)

## 2017-07-17 LAB — T4, FREE: Free T4: 0.9 ng/dL (ref 0.60–1.60)

## 2017-07-17 MED ORDER — HYDROXYZINE HCL 25 MG PO TABS: 25.0000 mg | ORAL_TABLET | Freq: Two times a day (BID) | ORAL | 0 refills | Status: DC | PRN

## 2017-07-17 NOTE — Patient Instructions (Addendum)
Preventive Care 40-64 Years, Male Preventive care refers to lifestyle choices and visits with your health care provider that can promote health and wellness. What does preventive care include?  A yearly physical exam. This is also called an annual well check.  Dental exams once or twice a year.  Routine eye exams. Ask your health care provider how often you should have your eyes checked.  Personal lifestyle choices, including: ? Daily care of your teeth and gums. ? Regular physical activity. ? Eating a healthy diet. ? Avoiding tobacco and drug use. ? Limiting alcohol use. ? Practicing safe sex. ? Taking low-dose aspirin every day starting at age 39. What happens during an annual well check? The services and screenings done by your health care provider during your annual well check will depend on your age, overall health, lifestyle risk factors, and family history of disease. Counseling Your health care provider may ask you questions about your:  Alcohol use.  Tobacco use.  Drug use.  Emotional well-being.  Home and relationship well-being.  Sexual activity.  Eating habits.  Work and work Statistician.  Screening You may have the following tests or measurements:  Height, weight, and BMI.  Blood pressure.  Lipid and cholesterol levels. These may be checked every 5 years, or more frequently if you are over 76 years old.  Skin check.  Lung cancer screening. You may have this screening every year starting at age 19 if you have a 30-pack-year history of smoking and currently smoke or have quit within the past 15 years.  Fecal occult blood test (FOBT) of the stool. You may have this test every year starting at age 26.  Flexible sigmoidoscopy or colonoscopy. You may have a sigmoidoscopy every 5 years or a colonoscopy every 10 years starting at age 17.  Prostate cancer screening. Recommendations will vary depending on your family history and other risks.  Hepatitis C  blood test.  Hepatitis B blood test.  Sexually transmitted disease (STD) testing.  Diabetes screening. This is done by checking your blood sugar (glucose) after you have not eaten for a while (fasting). You may have this done every 1-3 years.  Discuss your test results, treatment options, and if necessary, the need for more tests with your health care provider. Vaccines Your health care provider may recommend certain vaccines, such as:  Influenza vaccine. This is recommended every year.  Tetanus, diphtheria, and acellular pertussis (Tdap, Td) vaccine. You may need a Td booster every 10 years.  Varicella vaccine. You may need this if you have not been vaccinated.  Zoster vaccine. You may need this after age 79.  Measles, mumps, and rubella (MMR) vaccine. You may need at least one dose of MMR if you were born in 1957 or later. You may also need a second dose.  Pneumococcal 13-valent conjugate (PCV13) vaccine. You may need this if you have certain conditions and have not been vaccinated.  Pneumococcal polysaccharide (PPSV23) vaccine. You may need one or two doses if you smoke cigarettes or if you have certain conditions.  Meningococcal vaccine. You may need this if you have certain conditions.  Hepatitis A vaccine. You may need this if you have certain conditions or if you travel or work in places where you may be exposed to hepatitis A.  Hepatitis B vaccine. You may need this if you have certain conditions or if you travel or work in places where you may be exposed to hepatitis B.  Haemophilus influenzae type b (Hib) vaccine.  You may need this if you have certain risk factors.  Talk to your health care provider about which screenings and vaccines you need and how often you need them. This information is not intended to replace advice given to you by your health care provider. Make sure you discuss any questions you have with your health care provider. Document Released: 05/26/2015  Document Revised: 01/17/2016 Document Reviewed: 02/28/2015 Elsevier Interactive Patient Education  2018 [place] with Quitting Smoking Quitting smoking is a physical and mental challenge. You will face cravings, withdrawal symptoms, and temptation. Before quitting, work with your health care provider to make a plan that can help you cope. Preparation can help you quit and keep you from giving in. How can I cope with cravings? Cravings usually last for 5-10 minutes. If you get through it, the craving will pass. Consider taking the following actions to help you cope with cravings:  Keep your mouth busy: ? Chew sugar-free gum. ? Suck on hard candies or a straw. ? Brush your teeth.  Keep your hands and body busy: ? Immediately change to a different activity when you feel a craving. ? Squeeze or play with a ball. ? Do an activity or a hobby, like making bead jewelry, practicing needlepoint, or working with wood. ? Mix up your normal routine. ? Take a short exercise break. Go for a quick walk or run up and down stairs. ? Spend time in public places where smoking is not allowed.  Focus on doing something kind or helpful for someone else.  Call a friend or family member to talk during a craving.  Join a support group.  Call a quit line, such as 1-800-QUIT-NOW.  Talk with your health care provider about medicines that might help you cope with cravings and make quitting easier for you.  How can I deal with withdrawal symptoms? Your body may experience negative effects as it tries to get used to not having nicotine in the system. These effects are called withdrawal symptoms. They may include:  Feeling hungrier than normal.  Trouble concentrating.  Irritability.  Trouble sleeping.  Feeling depressed.  Restlessness and agitation.  Craving a cigarette.  To manage withdrawal symptoms:  Avoid places, people, and activities that trigger your cravings.  Remember why  you want to quit.  Get plenty of sleep.  Avoid coffee and other caffeinated drinks. These may worsen some of your symptoms.  How can I handle social situations? Social situations can be difficult when you are quitting smoking, especially in the first few weeks. To manage this, you can:  Avoid parties, bars, and other social situations where people might be smoking.  Avoid alcohol.  Leave right away if you have the urge to smoke.  Explain to your family and friends that you are quitting smoking. Ask for understanding and support.  Plan activities with friends or family where smoking is not an option.  What are some ways I can cope with stress? Wanting to smoke may cause stress, and stress can make you want to smoke. Find ways to manage your stress. Relaxation techniques can help. For example:  Breathe slowly and deeply, in through your nose and out through your mouth.  Listen to soothing, relaxing music.  Talk with a family member or friend about your stress.  Light a candle.  Soak in a bath or take a shower.  Think about a peaceful place.  What are some ways I can prevent weight gain? Be aware that  many people gain weight after they quit smoking. However, not everyone does. To keep from gaining weight, have a plan in place before you quit and stick to the plan after you quit. Your plan should include:  Having healthy snacks. When you have a craving, it may help to: ? Eat plain popcorn, crunchy carrots, celery, or other cut vegetables. ? Chew sugar-free gum.  Changing how you eat: ? Eat small portion sizes at meals. ? Eat 4-6 small meals throughout the day instead of 1-2 large meals a day. ? Be mindful when you eat. Do not watch television or do other things that might distract you as you eat.  Exercising regularly: ? Make time to exercise each day. If you do not have time for a long workout, do short bouts of exercise for 5-10 minutes several times a day. ? Do some  form of strengthening exercise, like weight lifting, and some form of aerobic exercise, like running or swimming.  Drinking plenty of water or other low-calorie or no-calorie drinks. Drink 6-8 glasses of water daily, or as much as instructed by your health care provider.  Summary  Quitting smoking is a physical and mental challenge. You will face cravings, withdrawal symptoms, and temptation to smoke again. Preparation can help you as you go through these challenges.  You can cope with cravings by keeping your mouth busy (such as by chewing gum), keeping your body and hands busy, and making calls to family, friends, or a helpline for people who want to quit smoking.  You can cope with withdrawal symptoms by avoiding places where people smoke, avoiding drinks with caffeine, and getting plenty of rest.  Ask your health care provider about the different ways to prevent weight gain, avoid stress, and handle social situations. This information is not intended to replace advice given to you by your health care provider. Make sure you discuss any questions you have with your health care provider. Document Released: 04/26/2016 Document Revised: 04/26/2016 Document Reviewed: 04/26/2016 Elsevier Interactive Patient Education  2018 [place] After being diagnosed with an anxiety disorder, you may be relieved to know why you have felt or behaved a certain way. It is natural to also feel overwhelmed about the treatment ahead and what it will mean for your life. With care and support, you can manage this condition and recover from it. How to cope with anxiety Dealing with stress Stress is your body's reaction to life changes and events, both good and bad. Stress can last just a few hours or it can be ongoing. Stress can play a major role in anxiety, so it is important to learn both how to cope with stress and how to think about it differently. Talk with your health care provider or  a counselor to learn more about stress reduction. He or she may suggest some stress reduction techniques, such as:  Music therapy. This can include creating or listening to music that you enjoy and that inspires you.  Mindfulness-based meditation. This involves being aware of your normal breaths, rather than trying to control your breathing. It can be done while sitting or walking.  Centering prayer. This is a kind of meditation that involves focusing on a word, phrase, or sacred image that is meaningful to you and that brings you peace.  Deep breathing. To do this, expand your stomach and inhale slowly through your nose. Hold your breath for 3-5 seconds. Then exhale slowly, allowing your stomach muscles to relax.  Self-talk. This is a skill where you identify thought patterns that lead to anxiety reactions and correct those thoughts.  Muscle relaxation. This involves tensing muscles then relaxing them.  Choose a stress reduction technique that fits your lifestyle and personality. Stress reduction techniques take time and practice. Set aside 5-15 minutes a day to do them. Therapists can offer training in these techniques. The training may be covered by some insurance plans. Other things you can do to manage stress include:  Keeping a stress diary. This can help you learn what triggers your stress and ways to control your response.  Thinking about how you respond to certain situations. You may not be able to control everything, but you can control your reaction.  Making time for activities that help you relax, and not feeling guilty about spending your time in this way.  Therapy combined with coping and stress-reduction skills provides the best chance for successful treatment. Medicines Medicines can help ease symptoms. Medicines for anxiety include:  Anti-anxiety drugs.  Antidepressants.  Beta-blockers.  Medicines may be used as the main treatment for anxiety disorder, along with  therapy, or if other treatments are not working. Medicines should be prescribed by a health care provider. Relationships Relationships can play a big part in helping you recover. Try to spend more time connecting with trusted friends and family members. Consider going to couples counseling, taking family education classes, or going to family therapy. Therapy can help you and others better understand the condition. How to recognize changes in your condition Everyone has a different response to treatment for anxiety. Recovery from anxiety happens when symptoms decrease and stop interfering with your daily activities at home or work. This may mean that you will start to:  Have better concentration and focus.  Sleep better.  Be less irritable.  Have more energy.  Have improved memory.  It is important to recognize when your condition is getting worse. Contact your health care provider if your symptoms interfere with home or work and you do not feel like your condition is improving. Where to find help and support: You can get help and support from these sources:  Self-help groups.  Online and [employer].  A trusted spiritual leader.  Couples counseling.  Family education classes.  Family therapy.  Follow these instructions at home:  Eat a healthy diet that includes plenty of vegetables, fruits, whole grains, low-fat dairy products, and lean protein. Do not eat a lot of foods that are high in solid fats, added sugars, or salt.  Exercise. Most adults should do the following: ? Exercise for at least 150 minutes each week. The exercise should increase your heart rate and make you sweat (moderate-intensity exercise). ? Strengthening exercises at least twice a week.  Cut down on caffeine, tobacco, alcohol, and other potentially harmful substances.  Get the right amount and quality of sleep. Most adults need 7-9 hours of sleep each night.  Make choices that simplify your  life.  Take over-the-counter and prescription medicines only as told by your health care provider.  Avoid caffeine, alcohol, and certain over-the-counter cold medicines. These may make you feel worse. Ask your pharmacist which medicines to avoid.  Keep all follow-up visits as told by your health care provider. This is important. Questions to ask your health care provider  Would I benefit from therapy?  How often should I follow up with a health care provider?  How long do I need to take medicine?  Are there any long-term  side effects of my medicine?  Are there any alternatives to taking medicine? Contact a health care provider if:  You have a hard time staying focused or finishing daily tasks.  You spend many hours a day feeling worried about everyday life.  You become exhausted by worry.  You start to have headaches, feel tense, or have nausea.  You urinate more than normal.  You have diarrhea. Get help right away if:  You have a racing heart and shortness of breath.  You have thoughts of hurting yourself or others. If you ever feel like you may hurt yourself or others, or have thoughts about taking your own life, get help right away. You can go to your nearest emergency department or call:  Your local emergency services (911 in the [place]  A suicide crisis helpline, such as the [place] at 514 741 0433. This is open 24-hours a day.  Summary  Taking steps to deal with stress can help calm you.  Medicines cannot cure anxiety disorders, but they can help ease symptoms.  Family, friends, and partners can play a big part in helping you recover from an anxiety disorder. This information is not intended to replace advice given to you by your health care provider. Make sure you discuss any questions you have with your health care provider. Document Released: 04/23/2016 Document Revised: 04/23/2016 Document Reviewed: 04/23/2016 Elsevier  Interactive Patient Education  [employer].

## 2017-07-17 NOTE — Progress Notes (Signed)
Subjective:     Roger Fowler is a 52 y.o. male and is here for a comprehensive physical exam. The patient reports problems - anxiety.  Pt states he is overall healthy.  Pt does states he was previously on pravastatin 40 mg but has been out x 7-8 months.  Pt endorses "issues with nerves" for several months.  Pt reports having a panic attack over the summer.  Pt states his temper is shorter.  He endorses increased stress at work.  Pt states he can feel himself get worked up.  Pt states if he has too much going on at home he may also become overwhelmed/worked up.  Pt has taken his mother's xanax for this. He is requesting an rx for xanax.  Pt advised this provider does not prescribe benzos.  Pt does not feel he needs counseling.  Tobacco use.  Patient endorses smoking 1/2 pack/day since age 52.  Patient has thought about quitting but has not committed to the idea.  Patient states sometimes he will small more when he is stressed.  Patient has not had a colonoscopy.  He has a family history of pancreatic cancer in his maternal grandmother and multiple myeloma in his mother.  Social History   Socioeconomic History  . Marital status: Married    Spouse name: Not on file  . Number of children: Not on file  . Years of education: Not on file  . Highest education level: Not on file  Social Needs  . Financial resource strain: Not on file  . Food insecurity - worry: Not on file  . Food insecurity - inability: Not on file  . Transportation needs - medical: Not on file  . Transportation needs - non-medical: Not on file  Occupational History  . Not on file  Tobacco Use  . Smoking status: Current Every Day Smoker    Packs/day: 1.00    Years: 27.00    Pack years: 27.00    Types: Cigarettes  . Smokeless tobacco: Current User  Substance and Sexual Activity  . Alcohol use: Yes  . Drug use: No  . Sexual activity: Not on file  Other Topics Concern  . Not on file  Social History Narrative   Occupation:  Facilities manager for [employer]   Current Smoker   Divorced   One son age 75 - currently serving in [place] [employer]. Deployed in [place]         Health Maintenance  Topic Date Due  . HIV Screening  09/01/1981  . COLONOSCOPY  09/01/2016  . TETANUS/TDAP  06/11/2017  . INFLUENZA VACCINE  03/19/2018 (Originally 12/11/2016)    The following portions of the patient's history were reviewed and updated as appropriate: allergies, current medications, past family history, past medical history, past social history, past surgical history and problem list.  Review of Systems A comprehensive review of systems was negative.  Psych:  +anxiety Objective:    BP 128/86 (BP Location: Left Arm, Patient Position: Sitting, Cuff Size: Normal)   Pulse 86   Temp 97.9 F (36.6 C) (Oral)   Ht 6' (1.829 m)   Wt 204 lb (92.5 kg)   SpO2 98%   BMI 27.67 kg/m  General appearance: alert, cooperative, appears stated age and no distress Head: Normocephalic, without obvious abnormality, atraumatic Eyes: No scleral icterus.  Arcus senilis present. PERRL Ears: normal TM's and external ear canals both ears Nose: Nares normal. Septum midline. Mucosa normal. No drainage or sinus tenderness. Throat: lips, mucosa, and  tongue normal; teeth and gums normal partial upper denture in place. Neck: no adenopathy, no carotid bruit, no JVD, supple, symmetrical, trachea midline and thyroid not enlarged, symmetric, no tenderness/mass/nodules Lungs: clear to auscultation bilaterally Heart: regular rate and rhythm, S1, S2 normal, no murmur, click, rub or gallop Abdomen: soft, non-tender; bowel sounds normal; no masses,  no organomegaly Male genitalia: deferred Extremities: extremities normal, atraumatic, no cyanosis or edema Pulses: 2+ and symmetric Neurologic: Alert and oriented X 3, normal strength and tone. Normal symmetric reflexes. Normal coordination and gait    Assessment:    Healthy male exam. Moderate  anxiety.     Plan:      Anticipatory guidance given including wearing seatbelts, smoke detectors in the home, increasing physical activity, increasing p.o. intake of water, increasing p.o. intake of vegetables -Handout given on healthcare maintenance -We will give Tdap this visit -We will obtain labs including lipid panel, CBC, CMP, hemoglobin A1c, TSH, T4 See After Visit Summary for Counseling Recommendations    Nicotine dependence -Smoking cessation counseling greater than 3 minutes, less than 10 minutes -Currently smoking 1/2 pack/day since age 74 -Patient is not currently interested in quitting -Given handout and options to help patient quit -We will reassess at each visit  Anxiety -PHQ 9 score 10 equals moderate anxiety -Patient advised this provider does not prescribe benzos. -Patient encouraged to consider counseling however he declines at this time. -We will obtain TSH and T4 -Given Rx for hydroxyzine as needed -Patient not to use medication that is not prescribed.  Screening for cholesterol level -Lipid panel  Screen for colon cancer -Referral to gastroenterology placed  Screening for diabetes -Hemoglobin A1c  Follow-up PRN  Grier Mitts, MD

## 2017-07-22 ENCOUNTER — Other Ambulatory Visit: Payer: Self-pay | Admitting: Family Medicine

## 2017-07-22 ENCOUNTER — Encounter: Payer: Self-pay | Admitting: Family Medicine

## 2017-07-22 MED ORDER — PRAVASTATIN SODIUM 40 MG PO TABS: ORAL_TABLET | ORAL | 2 refills | Status: DC

## 2017-09-03 ENCOUNTER — Telehealth: Payer: Self-pay | Admitting: Family Medicine

## 2017-09-03 NOTE — Telephone Encounter (Signed)
Copied from CRM [id number]. Topic: Quick Communication - See Telephone Encounter >> Sep 03, 2017  1:32 PM Roger Fowler, Rosey Batheresa D wrote: CRM for notification. See Telephone encounter for: 09/03/17. Patient called and said that he got a life insurance and they need his to do a HIV testing and also file out paperwork. He wants to know if she can do this without him coming into the office. Please call patient back, thanks.

## 2017-09-04 NOTE — Telephone Encounter (Signed)
Would pt need a office visit or a lab appt for this paperwork and HIV lab draw? Please advise.   (pt would like a detailed voice message left on his phone.)

## 2017-09-09 ENCOUNTER — Other Ambulatory Visit: Payer: Self-pay | Admitting: Family Medicine

## 2017-09-09 DIAGNOSIS — Z114 Encounter for screening for human immunodeficiency virus [HIV]: Secondary | ICD-10-CM

## 2017-09-09 NOTE — Telephone Encounter (Signed)
It depends on what info is needed on the paper work.  Please advise pt there will be a charge for form completion.  Pt can make a lab appt for HIV testing.

## 2017-09-10 ENCOUNTER — Other Ambulatory Visit (INDEPENDENT_AMBULATORY_CARE_PROVIDER_SITE_OTHER): Payer: Managed Care, Other (non HMO)

## 2017-09-10 ENCOUNTER — Telehealth: Payer: Self-pay | Admitting: Family Medicine

## 2017-09-10 DIAGNOSIS — Z114 Encounter for screening for human immunodeficiency virus [HIV]: Secondary | ICD-10-CM

## 2017-09-10 NOTE — Telephone Encounter (Signed)
[place] [employer] Form to be filled out.  Placed in Dr's folder.  Call [phone] upon completion.

## 2017-09-11 LAB — HIV ANTIBODY (ROUTINE TESTING W REFLEX): HIV 1&2 Ab, 4th Generation: NONREACTIVE

## 2017-09-11 NOTE — Telephone Encounter (Signed)
Labs have been drawn and patient dropped of paperwork. See 09/10/17 phone encounter.

## 2017-09-12 NOTE — Telephone Encounter (Signed)
Form completed as much as possible. Need feedback from patient to verify meds he is currently taking and when he last took them. If still taking pain medication and muscle relaxer, need names of meds and doses.Not sure that he still taking Protonix since it has not been filled since 2017.   Also need to know if patient is receiving any chiropractic treatment, physical therapy, or has any orthopedic hardware in place.  Called patient and left message to return call.

## 2017-09-12 NOTE — Telephone Encounter (Signed)
Please mail paperwork and copy of labs as requested to home address.  Address on file is confirmed

## 2017-09-15 NOTE — Telephone Encounter (Signed)
Patient called and I advised of the questions by Caryn Section, RN to complete filling out form. He says "the only medication I take now is Pravastatin. I stopped the Hydroxyzine because it hyped me up rather than calming me down. I don't take any pain medications or muscle relaxer and I don't see a chiropractor, no physical therapy, and no orthopedic hardware in place. I asked for the form to be mailed when it's completed." I advised this would be sent to the provider.

## 2017-09-15 NOTE — Telephone Encounter (Signed)
Form completed and placed in provider's red folder for review/signature.

## 2017-09-18 ENCOUNTER — Encounter: Payer: Self-pay | Admitting: Family Medicine

## 2017-09-18 NOTE — Telephone Encounter (Signed)
Form completed. On midpod.

## 2017-09-19 ENCOUNTER — Telehealth: Payer: Self-pay | Admitting: Family Medicine

## 2017-09-19 NOTE — Telephone Encounter (Signed)
Called patient and left message notifying him that form is ready for pick-up.  Phone note below states he wanted form to be mailed, but that is not what was noted on the initial form. If patient wants form to be mailed, I have requested he call back and give [place] a mailing address.  Form filed at front desk for pick up. Copy sent for scanning.

## 2017-09-19 NOTE — Telephone Encounter (Signed)
Copied from CRM [id number]. Topic: General - Other >> Sep 19, 2017  1:28 PM Raquel Sarna wrote: Pt requesting his papers up front for pick up be mailed to his home address asap.

## 2017-09-23 NOTE — Telephone Encounter (Signed)
A copy of the [place] voluntary life form has been mailed to pt as requested.

## 2018-02-07 ENCOUNTER — Encounter (HOSPITAL_COMMUNITY): Payer: Self-pay

## 2018-02-07 ENCOUNTER — Ambulatory Visit (HOSPITAL_COMMUNITY)
Admission: EM | Admit: 2018-02-07 | Discharge: 2018-02-07 | Disposition: A | Discharge status: Home / Self Care | Payer: Managed Care, Other (non HMO) | Attending: Physician Assistant | Admitting: Physician Assistant

## 2018-02-07 ENCOUNTER — Other Ambulatory Visit: Payer: Self-pay

## 2018-02-07 DIAGNOSIS — R058 Other specified cough: Secondary | ICD-10-CM

## 2018-02-07 DIAGNOSIS — R05 Cough: Secondary | ICD-10-CM | POA: Diagnosis not present

## 2018-02-07 MED ORDER — CETIRIZINE HCL 10 MG PO CAPS: 10.0000 mg | ORAL_CAPSULE | Freq: Every day | ORAL | 0 refills | Status: DC

## 2018-02-07 NOTE — Discharge Instructions (Signed)
Start the zyrtec tonight and take nightly. I think overall you are in excellent health.  Please consider stopping smoking.

## 2018-02-07 NOTE — ED Provider Notes (Addendum)
02/07/2018 5:14 PM   DOB: October 30, 1965 / MRN: [id number]  SUBJECTIVE:  Roger Fowler is a 52 y.o. male presenting for cough that started about 3 weeks ago.  Assoicates mucous production. .  Denies SOB, chest pain, hx or asthma.  Has tried zpack and pred both of which helped.   He has No Known Allergies.   He  has a past medical history of Arthritis, Hyperlipidemia, Psoriasis, and Tobacco use.    He  reports that he has been smoking cigarettes. He has a 27.00 pack-year smoking history. He uses smokeless tobacco. He reports that he drinks alcohol. He reports that he does not use drugs. He  has no sexual activity history on file. The patient  has no past surgical history on file.  His family history includes Cancer in his mother; Hypertension in his brother and mother.  Review of Systems  Constitutional: Negative for chills, diaphoresis and fever.  Eyes: Negative.   Respiratory: Positive for cough and sputum production. Negative for hemoptysis, shortness of breath and wheezing.   Cardiovascular: Negative for chest pain, orthopnea and leg swelling.  Gastrointestinal: Negative for nausea.  Skin: Negative for rash.  Neurological: Negative for dizziness, sensory change, speech change, focal weakness and headaches.    OBJECTIVE:  BP (!) 139/91 (BP Location: Right Arm)   Pulse 82   Temp 98 F (36.7 C) (Oral)   Resp 18   Wt 197 lb (89.4 kg)   SpO2 100%   BMI 26.72 kg/m   Wt Readings from Last 3 Encounters:  02/07/18 197 lb (89.4 kg)  07/17/17 204 lb (92.5 kg)  04/30/16 208 lb 8 oz (94.6 kg)   Temp Readings from Last 3 Encounters:  02/07/18 98 F (36.7 C) (Oral)  07/17/17 97.9 F (36.6 C) (Oral)  10/23/15 98 F (36.7 C) (Oral)   BP Readings from Last 3 Encounters:  02/07/18 (!) 139/91  07/17/17 128/86  04/30/16 130/80   Pulse Readings from Last 3 Encounters:  02/07/18 82  07/17/17 86  04/30/16 77    Physical Exam  Constitutional: He is oriented to person, place, and  time. He appears well-developed. He is active.  Non-toxic appearance. He does not appear ill.  Eyes: Pupils are equal, round, and reactive to light. Conjunctivae and EOM are normal.  Cardiovascular: Normal rate.  Pulmonary/Chest: Effort normal.  Abdominal: He exhibits no distension.  Musculoskeletal: Normal range of motion.  Neurological: He is alert and oriented to person, place, and time. No cranial nerve deficit. Coordination normal.  Skin: Skin is warm and dry. He is not diaphoretic. No pallor.  Psychiatric: He has a normal mood and affect.  Nursing note and vitals reviewed.   No results found for this or any previous visit (from the past 72 hour(s)).  No results found.  ASSESSMENT AND PLAN:   Allergic cough    Discharge Instructions     Start the zyrtec tonight and take nightly. I think overall you are in excellent health.  Please consider stopping smoking.         The patient is advised to call or return to clinic if he does not see an improvement in symptoms, or to seek the care of the closest emergency department if he worsens with the above plan.   Deliah Boston, MHS, PA-C 02/07/2018 5:14 PM   Ofilia Neas, PA-C 02/07/18 1714    Ofilia Neas, PA-C 02/07/18 1714

## 2018-02-07 NOTE — ED Triage Notes (Signed)
Pt would like a CXR because  He has had a cough and congestion x 2 weeks off and on. Pt has been taking his antibiotic but this lairing around.

## 2018-03-02 ENCOUNTER — Other Ambulatory Visit: Payer: Self-pay | Admitting: Physician Assistant

## 2018-03-10 ENCOUNTER — Encounter: Payer: Self-pay | Admitting: Family Medicine

## 2018-03-10 ENCOUNTER — Ambulatory Visit: Payer: Managed Care, Other (non HMO) | Admitting: Family Medicine

## 2018-03-10 VITALS — BP 128/82 | HR 75 | Temp 98.0°F | Ht 72.0 in | Wt 199.2 lb

## 2018-03-10 DIAGNOSIS — R066 Hiccough: Secondary | ICD-10-CM

## 2018-03-10 DIAGNOSIS — K219 Gastro-esophageal reflux disease without esophagitis: Secondary | ICD-10-CM

## 2018-03-10 MED ORDER — METOCLOPRAMIDE HCL 10 MG PO TABS: 10.0000 mg | ORAL_TABLET | Freq: Three times a day (TID) | ORAL | 0 refills | Status: DC | PRN

## 2018-03-10 MED ORDER — PANTOPRAZOLE SODIUM 40 MG PO TBEC: 40.0000 mg | DELAYED_RELEASE_TABLET | Freq: Every day | ORAL | 0 refills | Status: DC

## 2018-03-10 NOTE — Patient Instructions (Signed)
It was very nice to see you today!  I think your hiccups were due to acid reflux.  Please start Protonix every day for the next 2 weeks.  I will also send in a prescription for Reglan.  Please take this as needed if your hiccups return.  Please work on cutting out foods in your diet that worsen your reflux including chocolate, alcohol, and caffeine.  Stopping smoking will also help with this.  Please let me know if your symptoms worsen or do not improve over the next 1 to 2 weeks.  Take care, Dr Jimmey Ralph

## 2018-03-10 NOTE — Progress Notes (Signed)
   Subjective:  Roger Fowler is a 52 y.o. male who presents today for same-day appointment with a chief complaint of hiccups.   HPI:  Hiccups, Acute problem Started 3 days ago.  Symptoms stopped early this morning.  He has not had any hiccups since then.  He has had some associated abdominal bloating and esophageal reflux.  No nausea or vomiting.  No constipation or diarrhea.  No fevers or chills.  Recently had some spicy foods and thinks that this could have contributed.  No specific treatments tried.  No other obvious alleviating or aggravating factors.  No history of dysphasia.  No reported weight loss.  ROS: Per HPI  PMH: He reports that he has been smoking cigarettes. He has a 27.00 pack-year smoking history. He uses smokeless tobacco. He reports that he drinks alcohol. He reports that he does not use drugs.  Objective:  Physical Exam: BP 128/82 (BP Location: Left Arm, Patient Position: Sitting, Cuff Size: Normal)   Pulse 75   Temp 98 F (36.7 C) (Oral)   Ht 6' (1.829 m)   Wt 199 lb 3.2 oz (90.4 kg)   SpO2 97%   BMI 27.02 kg/m   Gen: NAD, resting comfortably CV: RRR with no murmurs appreciated Pulm: NWOB, CTAB with no crackles, wheezes, or rhonchi GI: Normal bowel sounds present. Soft, Nontender, Nondistended.  Assessment/Plan:  GERD/hiccups No red flag signs or symptoms.  Start Protonix 40 mg daily for the next few weeks.  Discussed dietary modification including limiting or eliminating alcohol, caffeine, chocolate, and spicy foods.  Also discussed importance of cutting back on smoking.  Patient voiced understanding.  We will also send in Reglan as needed in case his intractable hiccups return.  Discussed reasons to return to care.  Follow-up as needed.  If symptoms return or do not improve with above, would consider referral to GI.  Katina Degree. Jimmey Ralph, MD 03/10/2018 4:37 PM

## 2018-04-01 ENCOUNTER — Other Ambulatory Visit: Payer: Self-pay | Admitting: Family Medicine

## 2018-04-14 ENCOUNTER — Other Ambulatory Visit: Payer: Self-pay | Admitting: Family Medicine

## 2018-04-15 NOTE — Telephone Encounter (Signed)
Ok to refill 

## 2018-04-15 NOTE — Telephone Encounter (Signed)
Dr. Banks patient  

## 2018-04-17 NOTE — Telephone Encounter (Signed)
rx given one time for hiccups.  Pt has not been seen since.  Unsure if he still needs the rx.

## 2018-04-20 NOTE — Telephone Encounter (Signed)
Call pt left a message to call the office regarding his Rx request

## 2018-04-21 ENCOUNTER — Other Ambulatory Visit: Payer: Self-pay | Admitting: Family Medicine

## 2018-04-21 NOTE — Telephone Encounter (Signed)
Copied from CRM [id number]. Topic: General - Other >> Apr 21, 2018  3:19 PM Ronney LionArrington, Shykila A wrote: Medication: pantoprazole (PROTONIX) 40 MG tablet  Has the patient contacted their pharmacy? Yes  Preferred Pharmacy (with phone number or street name):  [id number] - [place], [place] - [address] [phone] (Phone) [phone] (Fax)   Agent: Please be advised that RX refills may take up to 3 business days. We ask that you follow-up with your pharmacy.

## 2018-04-23 ENCOUNTER — Other Ambulatory Visit: Payer: Self-pay | Admitting: Family Medicine

## 2018-04-23 MED ORDER — PANTOPRAZOLE SODIUM 40 MG PO TBEC: 40.0000 mg | DELAYED_RELEASE_TABLET | Freq: Every day | ORAL | 5 refills | Status: DC

## 2018-06-11 ENCOUNTER — Ambulatory Visit: Payer: Managed Care, Other (non HMO) | Admitting: Family Medicine

## 2018-06-11 ENCOUNTER — Encounter: Payer: Self-pay | Admitting: Family Medicine

## 2018-06-11 VITALS — BP 110/74 | HR 84 | Temp 97.7°F | Wt 206.0 lb

## 2018-06-11 DIAGNOSIS — F17210 Nicotine dependence, cigarettes, uncomplicated: Secondary | ICD-10-CM

## 2018-06-11 DIAGNOSIS — E785 Hyperlipidemia, unspecified: Secondary | ICD-10-CM | POA: Diagnosis not present

## 2018-06-11 DIAGNOSIS — Z131 Encounter for screening for diabetes mellitus: Secondary | ICD-10-CM | POA: Diagnosis not present

## 2018-06-11 LAB — LIPID PANEL
CHOL/HDL RATIO: 4
Cholesterol: 225 mg/dL — ABNORMAL HIGH (ref 0–200)
HDL: 57.1 mg/dL (ref >39.00)
LDL CALC: 142 mg/dL — AB (ref 0–99)
NonHDL: 167.87
Triglycerides: 130 mg/dL (ref 0.0–149.0)
VLDL: 26 mg/dL (ref 0.0–40.0)

## 2018-06-11 LAB — HEMOGLOBIN A1C: Hgb A1c MFr Bld: 5.9 % (ref 4.6–6.5)

## 2018-06-11 NOTE — Progress Notes (Signed)
Subjective:    Patient ID: Roger Fowler, male    DOB: 1965-07-09, 53 y.o.   MRN: [id number]  No chief complaint on file.   HPI Patient was seen today for follow-up.  Pt states he has returned for follow-up on cholesterol and blood sugar.  Last year pt's A1c was 5.6.  He states he is worried about his eating habits and wants to make sure his A1c has improved.  Pt is drinking more water daily, cutting down on fried foods, and eating more baked items.  Pt is taking pravastatin 40 mg daily.  He denies myalgias.  Pt states he is still smoking, 1/2 ppd.  Pt states he is considering quitting because he is "getting to the point of being sick and tired of being sick and tired".  Past Medical History:  Diagnosis Date  . Arthritis   . Hyperlipidemia   . Psoriasis   . Tobacco use     No Known Allergies  ROS General: Denies fever, chills, night sweats, changes in weight, changes in appetite HEENT: Denies headaches, ear pain, changes in vision, rhinorrhea, sore throat CV: Denies CP, palpitations, SOB, orthopnea Pulm: Denies SOB, cough, wheezing GI: Denies abdominal pain, nausea, vomiting, diarrhea, constipation GU: Denies dysuria, hematuria, frequency, vaginal discharge Msk: Denies muscle cramps, joint pains Neuro: Denies weakness, numbness, tingling Skin: Denies rashes, bruising Psych: Denies depression, anxiety, hallucinations    Objective:    Blood pressure 110/74, pulse 84, temperature 97.7 F (36.5 C), temperature source Oral, weight 206 lb (93.4 kg), SpO2 98 %.  Gen. Pleasant, well-nourished, in no distress, normal affect   HEENT: Roger Fowler/AT, face symmetric, no scleral icterus, PERRLA,  nares patent without drainage Lungs: no accessory muscle use, CTAB, no wheezes or rales Cardiovascular: RRR, no m/r/g, no peripheral edema Neuro:  A&Ox3, CN II-XII intact, normal gait  Wt Readings from Last 3 Encounters:  06/11/18 206 lb (93.4 kg)  03/10/18 199 lb 3.2 oz (90.4 kg)  02/07/18 197 lb  (89.4 kg)    Lab Results  Component Value Date   WBC 7.6 07/17/2017   HGB 15.0 07/17/2017   HCT 45.2 07/17/2017   PLT 194.0 07/17/2017   GLUCOSE 95 07/17/2017   CHOL 202 (H) 07/17/2017   TRIG 57.0 07/17/2017   HDL 74.20 07/17/2017   LDLDIRECT 187.9 01/09/2010   LDLCALC 116 (H) 07/17/2017   ALT 20 07/17/2017   AST 23 07/17/2017   NA 137 07/17/2017   K 4.3 07/17/2017   CL 104 07/17/2017   CREATININE 0.95 07/17/2017   BUN 9 07/17/2017   CO2 28 07/17/2017   TSH 0.82 07/17/2017   PSA 0.46 07/14/2015   HGBA1C 5.6 07/17/2017    Assessment/Plan:  Hyperlipidemia, unspecified hyperlipidemia type  -Lifestyle modifications encouraged -Continue pravastatin 40 mg daily - Plan: Lipid panel  Screening for diabetes mellitus  - Plan: Hemoglobin A1c  Cigarette nicotine dependence without complication -smoking cessation counseling greater than 3 minutes, less than 10 minutes -Currently smoking 1/2 pack/day -Contemplating quitting -Discussed various options -We will continue to address at each visit  Follow-up in 2 to 3 months for CPE, sooner if needed  Abbe Amsterdam, MD

## 2018-06-11 NOTE — Patient Instructions (Signed)
Dyslipidemia Dyslipidemia is an imbalance of waxy, fat-like substances (lipids) in the blood. The body needs lipids in small amounts. Dyslipidemia often involves a high level of cholesterol or triglycerides, which are types of lipids. Common forms of dyslipidemia include:  High levels of LDL cholesterol. LDL is the type of cholesterol that causes fatty deposits (plaques) to build up in the blood vessels that carry blood away from your heart (arteries).  Low levels of HDL cholesterol. HDL cholesterol is the type of cholesterol that protects against heart disease. High levels of HDL remove the LDL buildup from arteries.  High levels of triglycerides. Triglycerides are a fatty substance in the blood that is linked to a buildup of plaques in the arteries. What are the causes? Primary dyslipidemia is caused by changes (mutations) in genes that are passed down through families (inherited). These mutations cause several types of dyslipidemia. Secondary dyslipidemia is caused by lifestyle choices and diseases that lead to dyslipidemia, such as:  Eating a diet that is high in animal fat.  Not getting enough exercise.  Having diabetes, kidney disease, liver disease, or thyroid disease.  Drinking large amounts of alcohol.  Using certain medicines. What increases the risk? You are more likely to develop this condition if you are an older man or if you are a woman who has gone through menopause. Other risk factors include:  Having a family history of dyslipidemia.  Taking certain medicines, including birth control pills, steroids, some diuretics, and beta-blockers.  Smoking cigarettes.  Eating a high-fat diet.  Having certain medical conditions such as diabetes, polycystic ovary syndrome (PCOS), kidney disease, liver disease, or hypothyroidism.  Not exercising regularly.  Being overweight or obese with too much belly fat. What are the signs or symptoms? In most cases, dyslipidemia does not  usually cause any symptoms. In severe cases, very high lipid levels can cause:  Fatty bumps under the skin (xanthomas).  White or gray ring around the black center (pupil) of the eye. Very high triglyceride levels can cause inflammation of the pancreas (pancreatitis). How is this diagnosed? Your health care provider may diagnose dyslipidemia based on a routine blood test (fasting blood test). Because most people do not have symptoms of the condition, this blood testing (lipid profile) is done on adults age 81 and older and is repeated every 5 years. This test checks:  Total cholesterol. This measures the total amount of cholesterol in your blood, including LDL cholesterol, HDL cholesterol, and triglycerides. A healthy number is below 200.  LDL cholesterol. The target number for LDL cholesterol is different for each person, depending on individual risk factors. Ask your health care provider what your LDL cholesterol should be.  HDL cholesterol. An HDL level of 60 or higher is best because it helps to protect against heart disease. A number below 45 for men or below 64 for women increases the risk for heart disease.  Triglycerides. A healthy triglyceride number is below 150. If your lipid profile is abnormal, your health care provider may do other blood tests. How is this treated? Treatment depends on the type of dyslipidemia that you have and your other risk factors for heart disease and stroke. Your health care provider will have a target range for your lipid levels based on this information. For many people, this condition may be treated by lifestyle changes, such as diet and exercise. Your health care provider may recommend that you:  Get regular exercise.  Make changes to your diet.  Quit smoking if you  smoke. If diet changes and exercise do not help you reach your goals, your health care provider may also prescribe medicine to lower lipids. The most commonly prescribed type of medicine  lowers your LDL cholesterol (statin drug). If you have a high triglyceride level, your provider may prescribe another type of drug (fibrate) or an omega-3 fish oil supplement, or both. Follow these instructions at home:  Eating and drinking  Follow instructions from your health care provider or dietitian about eating or drinking restrictions.  Eat a healthy diet as told by your health care provider. This can help you reach and maintain a healthy weight, lower your LDL cholesterol, and raise your HDL cholesterol. This may include: ? Limiting your calories, if you are overweight. ? Eating more fruits, vegetables, whole grains, fish, and lean meats. ? Limiting saturated fat, trans fat, and cholesterol.  If you drink alcohol: ? Limit how much you use. ? Be aware of how much alcohol is in your drink. In the [place] one drink equals one 12 oz bottle of beer (355 mL), one 5 oz glass of wine (148 mL), or one 1 oz glass of hard liquor (44 mL).  Do not drink alcohol if: ? Your health care provider tells you not to drink. ? You are pregnant, may be pregnant, or are planning to become pregnant. Activity  Get regular exercise. Start an exercise and strength training program as told by your health care provider. Ask your health care provider what activities are safe for you. Your health care provider may recommend: ? 30 minutes of aerobic activity 4-6 days a week. Brisk walking is an example of aerobic activity. ? Strength training 2 days a week. General instructions  Do not use any products that contain nicotine or tobacco, such as cigarettes, e-cigarettes, and chewing tobacco. If you need help quitting, ask your health care provider.  Take over-the-counter and prescription medicines only as told by your health care provider. This includes supplements.  Keep all follow-up visits as told by your health care provider. Contact a health care provider if:  You are: ? Having trouble sticking to your  exercise or diet plan. ? Struggling to quit smoking or control your use of alcohol. Summary  Dyslipidemia often involves a high level of cholesterol or triglycerides, which are types of lipids.  Treatment depends on the type of dyslipidemia that you have and your other risk factors for heart disease and stroke.  For many people, treatment starts with lifestyle changes, such as diet and exercise.  Your health care provider may prescribe medicine to lower lipids. This information is not intended to replace advice given to you by your health care provider. Make sure you discuss any questions you have with your health care provider. Document Released: 05/04/2013 Document Revised: 12/22/2017 Document Reviewed: 11/28/2017 Elsevier Interactive Patient Education  2019 Elsevier Inc.  Preventing Type 2 Diabetes Mellitus Type 2 diabetes (type 2 diabetes mellitus) is a long-term (chronic) disease that affects blood sugar (glucose) levels. Normally, a hormone called insulin allows glucose to enter cells in the body. The cells use glucose for energy. In type 2 diabetes, one or both of these problems may be present:  The body does not make enough insulin.  The body does not respond properly to insulin that it makes (insulin resistance). Insulin resistance or lack of insulin causes excess glucose to build up in the blood instead of going into cells. As a result, high blood glucose (hyperglycemia) develops, which can cause many  complications. Being overweight or obese and having an inactive (sedentary) lifestyle can increase your risk for diabetes. Type 2 diabetes can be delayed or prevented by making certain nutrition and lifestyle changes. What nutrition changes can be made?   Eat healthy meals and snacks regularly. Keep a healthy snack with you for when you get hungry between meals, such as fruit or a handful of nuts.  Eat lean meats and proteins that are low in saturated fats, such as chicken, fish,  egg whites, and beans. Avoid processed meats.  Eat plenty of fruits and vegetables and plenty of grains that have not been processed (whole grains). It is recommended that you eat: ? 1?2 cups of fruit every day. ? 2?3 cups of vegetables every day. ? 6?8 oz of whole grains every day, such as oats, whole wheat, bulgur, brown rice, quinoa, and millet.  Eat low-fat dairy products, such as milk, yogurt, and cheese.  Eat foods that contain healthy fats, such as nuts, avocado, olive oil, and canola oil.  Drink water throughout the day. Avoid drinks that contain added sugar, such as soda or sweet tea.  Follow instructions from your health care provider about specific eating or drinking restrictions.  Control how much food you eat at a time (portion size). ? Check food labels to find out the serving sizes of foods. ? Use a kitchen scale to weigh amounts of foods.  Saute or steam food instead of frying it. Cook with water or broth instead of oils or butter.  Limit your intake of: ? Salt (sodium). Have no more than 1 tsp (2,400 mg) of sodium a day. If you have heart disease or high blood pressure, have less than ? tsp (1,500 mg) of sodium a day. ? Saturated fat. This is fat that is solid at room temperature, such as butter or fat on meat. What lifestyle changes can be made? Activity   Do moderate-intensity physical activity for at least 30 minutes on at least 5 days of the week, or as much as told by your health care provider.  Ask your health care provider what activities are safe for you. A mix of physical activities may be best, such as walking, swimming, cycling, and strength training.  Try to add physical activity into your day. For example: ? Park in spots that are farther away than usual, so that you walk more. For example, park in a far corner of the parking lot when you go to the office or the grocery store. ? Take a walk during your lunch break. ? Use stairs instead of elevators  or escalators. Weight Loss  Lose weight as directed. Your health care provider can determine how much weight loss is best for you and can help you lose weight safely.  If you are overweight or obese, you may be instructed to lose at least 5?7 % of your body weight. Alcohol and Tobacco   Limit alcohol intake to no more than 1 drink a day for nonpregnant women and 2 drinks a day for men. One drink equals 12 oz of beer, 5 oz of wine, or 1 oz of hard liquor.  Do not use any tobacco products, such as cigarettes, chewing tobacco, and e-cigarettes. If you need help quitting, ask your health care provider. Work With Your Health Care Provider  Have your blood glucose tested regularly, as told by your health care provider.  Discuss your risk factors and how you can reduce your risk for diabetes.  Get screening  tests as told by your health care provider. You may have screening tests regularly, especially if you have certain risk factors for type 2 diabetes.  Make an appointment with a diet and nutrition specialist (registered dietitian). A registered dietitian can help you make a healthy eating plan and can help you understand portion sizes and food labels. Why are these changes important?  It is possible to prevent or delay type 2 diabetes and related health problems by making lifestyle and nutrition changes.  It can be difficult to recognize signs of type 2 diabetes. The best way to avoid possible damage to your body is to take actions to prevent the disease before you develop symptoms. What can happen if changes are not made?  Your blood glucose levels may keep increasing. Having high blood glucose for a long time is dangerous. Too much glucose in your blood can damage your blood vessels, heart, kidneys, nerves, and eyes.  You may develop prediabetes or type 2 diabetes. Type 2 diabetes can lead to many chronic health problems and complications, such as: ? Heart  disease. ? Stroke. ? Blindness. ? Kidney disease. ? Depression. ? Poor circulation in the feet and legs, which could lead to surgical removal (amputation) in severe cases. Where to find support  Ask your health care provider to recommend a registered dietitian, diabetes educator, or weight loss program.  Look for local or online weight loss groups.  Join a gym, fitness club, or outdoor activity group, such as a walking club. Where to find more information To learn more about diabetes and diabetes prevention, visit:  American Diabetes Association (ADA): www.diabetes.[employer] of Diabetes and Digestive and Kidney Diseases: ToyArticles.ca To learn more about healthy eating, visit:  The [place] Department of Agriculture Architect), Choose My Plate: http://yates.biz/  Office of Disease Prevention and Health Promotion (ODPHP), Dietary Guidelines: ListingMagazine.si Summary  You can reduce your risk for type 2 diabetes by increasing your physical activity, eating healthy foods, and losing weight as directed.  Talk with your health care provider about your risk for type 2 diabetes. Ask about any blood tests or screening tests that you need to have. This information is not intended to replace advice given to you by your health care provider. Make sure you discuss any questions you have with your health care provider. Document Released: 08/21/2015 Document Revised: 04/10/2017 Document Reviewed: 06/20/2015 Elsevier Interactive Patient Education  2019 [employer].

## 2018-06-12 ENCOUNTER — Ambulatory Visit: Payer: Self-pay

## 2018-06-12 NOTE — Telephone Encounter (Signed)
Increase po intake of water and fluids.  Stop taking the Benadryl.  Use OTC cortisone.  Also f/u with your Dermatologist.

## 2018-06-12 NOTE — Telephone Encounter (Signed)
Patient called in with c/o "headaches/dizziness." He says "I was in the office yesterday and I apologize I didn't disclose more information to Dr. Salomon Fick. I was more concerned to find out of I had hypertension and/or diabetes. I did mention about headaches at night, but didn't mention about the dizziness. This is not all the time, but happens at any given time when I'm moving around fast. The room doesn't spin, just feel a little dizzy. I'm fine now, no dizziness, no headache. The dizziness is mild." I asked about other symptoms, he says "I'm having flare up of my eczema and psoriasis. I've been taking Benadryl, but it's not helping." According to protocol, see PCP within 3 days, patient says "I will just try some things first. I've been sleeping with gloves on at night to give my hands some moisture, so I will not sleep with them on to see if that helps my headaches. Maybe they are cutting off my circulation. I will also not take the Benadryl. Is there something for itching that she can call in for me? Will someone call and let me know?"  Pharmacy:  [id number] [place], [place] - [address] [phone] (Phone) [phone] (Fax)      Reason for Disposition . [1] MILD dizziness (e.g., walking normally) AND [2] has NOT been evaluated by physician for this  (Exception: dizziness caused by heat exposure, sudden standing, or poor fluid intake)  Answer Assessment - Initial Assessment Questions 1. DESCRIPTION: "Describe your dizziness."     Little dizzy when moving too fast 2. LIGHTHEADED: "Do you feel lightheaded?" (e.g., somewhat faint, woozy, weak upon standing)     Yes 3. VERTIGO: "Do you feel like either you or the room is spinning or tilting?" (i.e. vertigo)     No 4. SEVERITY: "How bad is it?"  "Do you feel like you are going to faint?" "Can you stand and walk?"   - MILD - walking normally   - MODERATE - interferes with normal activities (e.g., work, school)    - SEVERE - unable  to stand, requires support to walk, feels like passing out now.      Mild 5. ONSET:  "When did the dizziness begin?"     Noticed it today while walking in the store 6. AGGRAVATING FACTORS: "Does anything make it worse?" (e.g., standing, change in head position)     I don't know 7. HEART RATE: "Can you tell me your heart rate?" "How many beats in 15 seconds?"  (Note: not all patients can do this)       N/A 8. CAUSE: "What do you think is causing the dizziness?"     I don't know 9. RECURRENT SYMPTOM: "Have you had dizziness before?" If so, ask: "When was the last time?" "What happened that time?"     I've experienced it before, not evaluated 10. OTHER SYMPTOMS: "Do you have any other symptoms?" (e.g., fever, chest pain, vomiting, diarrhea, bleeding)       Headache at night for the past 2 nights; pink dots on my skin 11. PREGNANCY: "Is there any chance you are pregnant?" "When was your last menstrual period?"       N/A  Protocols used: DIZZINESS [facility]

## 2018-06-12 NOTE — Telephone Encounter (Signed)
Please advise 

## 2018-07-29 ENCOUNTER — Other Ambulatory Visit: Payer: Self-pay | Admitting: Family Medicine

## 2018-08-06 ENCOUNTER — Telehealth: Payer: Self-pay | Admitting: Family Medicine

## 2018-08-06 NOTE — Telephone Encounter (Signed)
Copied from CRM [id number]. Topic: Quick Communication - Rx Refill/Question >> Aug 06, 2018  4:08 PM [place], [place] D wrote: Medication: pravastatin (PRAVACHOL) 40 MG tablet / Pt stated that the pharmacy told him refill for pravastatin was denied and he does not know why. Pt's last OV was 06/11/18. Please advise pt if refill will be approved.  Has the patient contacted their pharmacy? Yes.   (Agent: If no, request that the patient contact the pharmacy for the refill.) (Agent: If yes, when and what did the pharmacy advise?)  Preferred Pharmacy (with phone number or street name): [id number] - [place], [place] - [address] [phone] (Phone) [phone] (Fax)    Agent: Please be advised that RX refills may take up to 3 business days. We ask that you follow-up with your pharmacy.

## 2018-08-07 ENCOUNTER — Other Ambulatory Visit: Payer: Self-pay

## 2018-08-07 MED ORDER — PRAVASTATIN SODIUM 40 MG PO TABS: ORAL_TABLET | ORAL | 2 refills | Status: DC

## 2018-08-07 NOTE — Telephone Encounter (Signed)
Refill sent to pharmacy.   

## 2019-03-19 ENCOUNTER — Other Ambulatory Visit: Payer: Self-pay

## 2019-03-19 DIAGNOSIS — Z20822 Contact with and (suspected) exposure to covid-19: Secondary | ICD-10-CM

## 2019-03-20 LAB — NOVEL CORONAVIRUS, NAA: SARS-CoV-2, NAA: NOT DETECTED

## 2019-08-23 ENCOUNTER — Ambulatory Visit (INDEPENDENT_AMBULATORY_CARE_PROVIDER_SITE_OTHER): Payer: Managed Care, Other (non HMO) | Admitting: Family Medicine

## 2019-08-23 ENCOUNTER — Encounter: Payer: Self-pay | Admitting: Family Medicine

## 2019-08-23 ENCOUNTER — Other Ambulatory Visit: Payer: Self-pay

## 2019-08-23 VITALS — BP 118/70 | HR 92 | Temp 98.1°F | Wt 202.0 lb

## 2019-08-23 DIAGNOSIS — Z125 Encounter for screening for malignant neoplasm of prostate: Secondary | ICD-10-CM | POA: Diagnosis not present

## 2019-08-23 DIAGNOSIS — L409 Psoriasis, unspecified: Secondary | ICD-10-CM

## 2019-08-23 DIAGNOSIS — R519 Headache, unspecified: Secondary | ICD-10-CM

## 2019-08-23 DIAGNOSIS — J019 Acute sinusitis, unspecified: Secondary | ICD-10-CM | POA: Diagnosis not present

## 2019-08-23 DIAGNOSIS — E782 Mixed hyperlipidemia: Secondary | ICD-10-CM

## 2019-08-23 DIAGNOSIS — J302 Other seasonal allergic rhinitis: Secondary | ICD-10-CM

## 2019-08-23 DIAGNOSIS — Z1211 Encounter for screening for malignant neoplasm of colon: Secondary | ICD-10-CM | POA: Diagnosis not present

## 2019-08-23 DIAGNOSIS — Z0000 Encounter for general adult medical examination without abnormal findings: Secondary | ICD-10-CM | POA: Diagnosis not present

## 2019-08-23 DIAGNOSIS — F17210 Nicotine dependence, cigarettes, uncomplicated: Secondary | ICD-10-CM | POA: Diagnosis not present

## 2019-08-23 DIAGNOSIS — Z131 Encounter for screening for diabetes mellitus: Secondary | ICD-10-CM

## 2019-08-23 LAB — CBC WITH DIFFERENTIAL/PLATELET
Basophils Absolute: 0 10*3/uL (ref 0.0–0.1)
Basophils Relative: 0.5 % (ref 0.0–3.0)
Eosinophils Absolute: 0 10*3/uL (ref 0.0–0.7)
Eosinophils Relative: 0.2 % (ref 0.0–5.0)
HCT: 43.8 % (ref 39.0–52.0)
Hemoglobin: 14.6 g/dL (ref 13.0–17.0)
Lymphocytes Relative: 21.1 % (ref 12.0–46.0)
Lymphs Abs: 1.5 10*3/uL (ref 0.7–4.0)
MCHC: 33.3 g/dL (ref 30.0–36.0)
MCV: 83.3 fl (ref 78.0–100.0)
Monocytes Absolute: 0.6 10*3/uL (ref 0.1–1.0)
Monocytes Relative: 7.9 % (ref 3.0–12.0)
Neutro Abs: 5 10*3/uL (ref 1.4–7.7)
Neutrophils Relative %: 70.3 % (ref 43.0–77.0)
Platelets: 202 10*3/uL (ref 150.0–400.0)
RBC: 5.26 Mil/uL (ref 4.22–5.81)
RDW: 14.6 % (ref 11.5–15.5)
WBC: 7.1 10*3/uL (ref 4.0–10.5)

## 2019-08-23 LAB — COMPREHENSIVE METABOLIC PANEL
ALT: 28 U/L (ref 0–53)
AST: 32 U/L (ref 0–37)
Albumin: 4.6 g/dL (ref 3.5–5.2)
Alkaline Phosphatase: 71 U/L (ref 39–117)
BUN: 9 mg/dL (ref 6–23)
CO2: 25 mEq/L (ref 19–32)
Calcium: 9.6 mg/dL (ref 8.4–10.5)
Chloride: 103 mEq/L (ref 96–112)
Creatinine, Ser: 0.99 mg/dL (ref 0.40–1.50)
GFR: 95.69 mL/min (ref >60.00)
Glucose, Bld: 96 mg/dL (ref 70–99)
Potassium: 4.6 mEq/L (ref 3.5–5.1)
Sodium: 137 mEq/L (ref 135–145)
Total Bilirubin: 0.8 mg/dL (ref 0.2–1.2)
Total Protein: 7.2 g/dL (ref 6.0–8.3)

## 2019-08-23 LAB — HEMOGLOBIN A1C: Hgb A1c MFr Bld: 5.7 % (ref 4.6–6.5)

## 2019-08-23 LAB — LIPID PANEL
Cholesterol: 217 mg/dL — ABNORMAL HIGH (ref 0–200)
HDL: 63.9 mg/dL (ref >39.00)
LDL Cholesterol: 140 mg/dL — ABNORMAL HIGH (ref 0–99)
NonHDL: 153.05
Total CHOL/HDL Ratio: 3
Triglycerides: 63 mg/dL (ref 0.0–149.0)
VLDL: 12.6 mg/dL (ref 0.0–40.0)

## 2019-08-23 LAB — PSA: PSA: 0.54 ng/mL (ref 0.10–4.00)

## 2019-08-23 MED ORDER — FLUTICASONE PROPIONATE 50 MCG/ACT NA SUSP: 1.0000 | Freq: Every day | NASAL | 0 refills | Status: DC

## 2019-08-23 MED ORDER — AMOXICILLIN 500 MG PO CAPS: 500.0000 mg | ORAL_CAPSULE | Freq: Three times a day (TID) | ORAL | 0 refills | Status: DC

## 2019-08-23 NOTE — Patient Instructions (Addendum)
Preventive Care 41-53 Years Old, Male Preventive care refers to lifestyle choices and visits with your health care provider that can promote health and wellness. This includes:  A yearly physical exam. This is also called an annual well check.  Regular dental and eye exams.  Immunizations.  Screening for certain conditions.  Healthy lifestyle choices, such as eating a healthy diet, getting regular exercise, not using drugs or products that contain nicotine and tobacco, and limiting alcohol use. What can I expect for my preventive care visit? Physical exam Your health care provider will check:  Height and weight. These may be used to calculate body mass index (BMI), which is a measurement that tells if you are at a healthy weight.  Heart rate and blood pressure.  Your skin for abnormal spots. Counseling Your health care provider may ask you questions about:  Alcohol, tobacco, and drug use.  Emotional well-being.  Home and relationship well-being.  Sexual activity.  Eating habits.  Work and work Statistician. What immunizations do I need?  Influenza (flu) vaccine  This is recommended every year. Tetanus, diphtheria, and pertussis (Tdap) vaccine  You may need a Td booster every 10 years. Varicella (chickenpox) vaccine  You may need this vaccine if you have not already been vaccinated. Zoster (shingles) vaccine  You may need this after age 64. Measles, mumps, and rubella (MMR) vaccine  You may need at least one dose of MMR if you were born in 1957 or later. You may also need a second dose. Pneumococcal conjugate (PCV13) vaccine  You may need this if you have certain conditions and were not previously vaccinated. Pneumococcal polysaccharide (PPSV23) vaccine  You may need one or two doses if you smoke cigarettes or if you have certain conditions. Meningococcal conjugate (MenACWY) vaccine  You may need this if you have certain conditions. Hepatitis A  vaccine  You may need this if you have certain conditions or if you travel or work in places where you may be exposed to hepatitis A. Hepatitis B vaccine  You may need this if you have certain conditions or if you travel or work in places where you may be exposed to hepatitis B. Haemophilus influenzae type b (Hib) vaccine  You may need this if you have certain risk factors. Human papillomavirus (HPV) vaccine  If recommended by your health care provider, you may need three doses over 6 months. You may receive vaccines as individual doses or as more than one vaccine together in one shot (combination vaccines). Talk with your health care provider about the risks and benefits of combination vaccines. What tests do I need? Blood tests  Lipid and cholesterol levels. These may be checked every 5 years, or more frequently if you are over 60 years old.  Hepatitis C test.  Hepatitis B test. Screening  Lung cancer screening. You may have this screening every year starting at age 43 if you have a 30-pack-year history of smoking and currently smoke or have quit within the past 15 years.  Prostate cancer screening. Recommendations will vary depending on your family history and other risks.  Colorectal cancer screening. All adults should have this screening starting at age 72 and continuing until age 2. Your health care provider may recommend screening at age 14 if you are at increased risk. You will have tests every 1-10 years, depending on your results and the type of screening test.  Diabetes screening. This is done by checking your blood sugar (glucose) after you have not eaten  for a while (fasting). You may have this done every 1-3 years.  Sexually transmitted disease (STD) testing. Follow these instructions at home: Eating and drinking  Eat a diet that includes fresh fruits and vegetables, whole grains, lean protein, and low-fat dairy products.  Take vitamin and mineral supplements as  recommended by your health care provider.  Do not drink alcohol if your health care provider tells you not to drink.  If you drink alcohol: ? Limit how much you have to 0-2 drinks a day. ? Be aware of how much alcohol is in your drink. In the [place] one drink equals one 12 oz bottle of beer (355 mL), one 5 oz glass of wine (148 mL), or one 1 oz glass of hard liquor (44 mL). Lifestyle  Take daily care of your teeth and gums.  Stay active. Exercise for at least 30 minutes on 5 or more days each week.  Do not use any products that contain nicotine or tobacco, such as cigarettes, e-cigarettes, and chewing tobacco. If you need help quitting, ask your health care provider.  If you are sexually active, practice safe sex. Use a condom or other form of protection to prevent STIs (sexually transmitted infections).  Talk with your health care provider about taking a low-dose aspirin every day starting at age 54. What's next?  Go to your health care provider once a year for a well check visit.  Ask your health care provider how often you should have your eyes and teeth checked.  Stay up to date on all vaccines. This information is not intended to replace advice given to you by your health care provider. Make sure you discuss any questions you have with your health care provider. Document Revised: 04/23/2018 Document Reviewed: 04/23/2018 Elsevier Patient Education  2020 [place] Risks of Smoking Smoking cigarettes is very bad for your health. Tobacco smoke has over 200 known poisons in it. It contains the poisonous gases nitrogen oxide and carbon monoxide. There are over 60 chemicals in tobacco smoke that cause cancer. Smoking is difficult to quit because a chemical in tobacco, called nicotine, causes addiction or dependence. When you smoke and inhale, nicotine is absorbed rapidly into the bloodstream through your lungs. Both inhaled and non-inhaled nicotine may be addictive. What  are the risks of cigarette smoke? Cigarette smokers have an increased risk of many serious medical problems, including:  Lung cancer.  Lung disease, such as pneumonia, bronchitis, and emphysema.  Chest pain (angina) and heart attack because the heart is not getting enough oxygen.  Heart disease and peripheral blood vessel disease.  High blood pressure (hypertension).  Stroke.  Oral cancer, including cancer of the lip, mouth, or voice box.  Bladder cancer.  Pancreatic cancer.  Cervical cancer.  Pregnancy complications, including premature birth.  Stillbirths and smaller newborn babies, birth defects, and genetic damage to sperm.  Early menopause.  Lower estrogen level for women.  Infertility.  Facial wrinkles.  Blindness.  Increased risk of broken bones (fractures).  Senile dementia.  Stomach ulcers and internal bleeding.  Delayed wound healing and increased risk of complications during surgery.  Even smoking lightly shortens your life expectancy by several years. Because of secondhand smoke exposure, children of smokers have an increased risk of the following:  Sudden infant death syndrome (SIDS).  Respiratory infections.  Lung cancer.  Heart disease.  Ear infections. What are the benefits of quitting? There are many health benefits of quitting smoking. Here are some of them:  Within days of quitting smoking, your risk of having a heart attack decreases, your blood flow improves, and your lung capacity improves. Blood pressure, pulse rate, and breathing patterns start returning to normal soon after quitting.  Within months, your lungs may clear up completely.  Quitting for 10 years reduces your risk of developing lung cancer and heart disease to almost that of a nonsmoker.  People who quit may see an improvement in their overall quality of life. How do I quit smoking?     Smoking is an addiction with both physical and psychological effects, and  longtime habits can be hard to change. Your health care provider can recommend:  Programs and community resources, which may include group support, education, or talk therapy.  Prescription medicines to help reduce cravings.  Nicotine replacement products, such as patches, gum, and nasal sprays. Use these products only as directed. Do not replace cigarette smoking with electronic cigarettes, which are commonly called e-cigarettes. The safety of e-cigarettes is not known, and some may contain harmful chemicals.  A combination of two or more of these methods. Where to find more information  American Lung Association: www.lung.org  American Cancer Society: www.cancer.org Summary  Smoking cigarettes is very bad for your health. Cigarette smokers have an increased risk of many serious medical problems, including several cancers, heart disease, and stroke.  Smoking is an addiction with both physical and psychological effects, and longtime habits can be hard to change.  By stopping right away, you can greatly reduce the risk of medical problems for you and your family.  To help you quit smoking, your health care provider can recommend programs, community resources, prescription medicines, and nicotine replacement products such as patches, gum, and nasal sprays. This information is not intended to replace advice given to you by your health care provider. Make sure you discuss any questions you have with your health care provider. Document Revised: 07/31/2017 Document Reviewed: 05/03/2016 Elsevier Patient Education  [place].  High Cholesterol  High cholesterol is a condition in which the blood has high levels of a white, waxy, fat-like substance (cholesterol). The human body needs small amounts of cholesterol. The liver makes all the cholesterol that the body needs. Extra (excess) cholesterol comes from the food that we eat. Cholesterol is carried from the liver by the blood through  the blood vessels. If you have high cholesterol, deposits (plaques) may build up on the walls of your blood vessels (arteries). Plaques make the arteries narrower and stiffer. Cholesterol plaques increase your risk for heart attack and stroke. Work with your health care provider to keep your cholesterol levels in a healthy range. What increases the risk? This condition is more likely to develop in people who:  Eat foods that are high in animal fat (saturated fat) or cholesterol.  Are overweight.  Are not getting enough exercise.  Have a family history of high cholesterol. What are the signs or symptoms? There are no symptoms of this condition. How is this diagnosed? This condition may be diagnosed from the results of a blood test.  If you are older than age 78, your health care provider may check your cholesterol every 4-6 years.  You may be checked more often if you already have high cholesterol or other risk factors for heart disease. The blood test for cholesterol measures:  "Bad" cholesterol (LDL cholesterol). This is the main type of cholesterol that causes heart disease. The desired level for LDL is less than 100.  "Good" cholesterol (  HDL cholesterol). This type helps to protect against heart disease by cleaning the arteries and carrying the LDL away. The desired level for HDL is 60 or higher.  Triglycerides. These are fats that the body can store or burn for energy. The desired number for triglycerides is lower than 150.  Total cholesterol. This is a measure of the total amount of cholesterol in your blood, including LDL cholesterol, HDL cholesterol, and triglycerides. A healthy number is less than 200. How is this treated? This condition is treated with diet changes, lifestyle changes, and medicines. Diet changes  This may include eating more whole grains, fruits, vegetables, nuts, and fish.  This may also include cutting back on red meat and foods that have a lot of added  sugar. Lifestyle changes  Changes may include getting at least 40 minutes of aerobic exercise 3 times a week. Aerobic exercises include walking, biking, and swimming. Aerobic exercise along with a healthy diet can help you maintain a healthy weight.  Changes may also include quitting smoking. Medicines  Medicines are usually given if diet and lifestyle changes have failed to reduce your cholesterol to healthy levels.  Your health care provider may prescribe a statin medicine. Statin medicines have been shown to reduce cholesterol, which can reduce the risk of heart disease. Follow these instructions at home: Eating and drinking If told by your health care provider:  Eat chicken (without skin), fish, veal, shellfish, ground [place] breast, and round or loin cuts of red meat.  Do not eat fried foods or fatty meats, such as hot dogs and salami.  Eat plenty of fruits, such as apples.  Eat plenty of vegetables, such as broccoli, potatoes, and carrots.  Eat beans, peas, and lentils.  Eat grains such as barley, rice, couscous, and bulgur wheat.  Eat pasta without cream sauces.  Use skim or nonfat milk, and eat low-fat or nonfat yogurt and cheeses.  Do not eat or drink whole milk, cream, ice cream, egg yolks, or hard cheeses.  Do not eat stick margarine or tub margarines that contain trans fats (also called partially hydrogenated oils).  Do not eat saturated tropical oils, such as coconut oil and palm oil.  Do not eat cakes, cookies, crackers, or other baked goods that contain trans fats.  General instructions  Exercise as directed by your health care provider. Increase your activity level with activities such as gardening, walking, and taking the stairs.  Take over-the-counter and prescription medicines only as told by your health care provider.  Do not use any products that contain nicotine or tobacco, such as cigarettes and e-cigarettes. If you need help quitting, ask your  health care provider.  Keep all follow-up visits as told by your health care provider. This is important. Contact a health care provider if:  You are struggling to maintain a healthy diet or weight.  You need help to start on an exercise program.  You need help to stop smoking. Get help right away if:  You have chest pain.  You have trouble breathing. This information is not intended to replace advice given to you by your health care provider. Make sure you discuss any questions you have with your health care provider. Document Revised: 05/02/2017 Document Reviewed: 10/28/2015 Elsevier Patient Education  [place].  Allergic Rhinitis, Adult Allergic rhinitis is a reaction to allergens in the air. Allergens are tiny specks (particles) in the air that cause your body to have an allergic reaction. This condition cannot be passed  from person to person (is not contagious). Allergic rhinitis cannot be cured, but it can be controlled. There are two types of allergic rhinitis:  Seasonal. This type is also called hay fever. It happens only during certain times of the year.  Perennial. This type can happen at any time of the year. What are the causes? This condition may be caused by:  Pollen from grasses, trees, and weeds.  House dust mites.  Pet dander.  Mold. What are the signs or symptoms? Symptoms of this condition include:  Sneezing.  Runny or stuffy nose (nasal congestion).  A lot of mucus in the back of the throat (postnasal drip).  Itchy nose.  Tearing of the eyes.  Trouble sleeping.  Being sleepy during day. How is this treated? There is no cure for this condition. You should avoid things that trigger your symptoms (allergens). Treatment can help to relieve symptoms. This may include:  Medicines that block allergy symptoms, such as antihistamines. These may be given as a shot, nasal spray, or pill.  Shots that are given until your body becomes less  sensitive to the allergen (desensitization).  Stronger medicines, if all other treatments have not worked. Follow these instructions at home: Avoiding allergens   Find out what you are allergic to. Common allergens include smoke, dust, and pollen.  Avoid them if you can. These are some of the things that you can do to avoid allergens: ? Replace carpet with wood, tile, or vinyl flooring. Carpet can trap dander and dust. ? Clean any mold found in the home. ? Do not smoke. Do not allow smoking in your home. ? Change your heating and air conditioning filter at least once a month. ? During allergy season:  Keep windows closed as much as you can. If possible, use air conditioning when there is a lot of pollen in the air.  Use a special filter for allergies with your furnace and air conditioner.  Plan outdoor activities when pollen counts are lowest. This is usually during the early morning or evening hours.  If you do go outdoors when pollen count is high, wear a special mask for people with allergies.  When you come indoors, take a shower and change your clothes before sitting on furniture or bedding. General instructions  Do not use fans in your home.  Do not hang clothes outside to dry.  Wear sunglasses to keep pollen out of your eyes.  Wash your hands right away after you touch household pets.  Take over-the-counter and prescription medicines only as told by your doctor.  Keep all follow-up visits as told by your doctor. This is important. Contact a doctor if:  You have a fever.  You have a cough that does not go away (is persistent).  You start to make whistling sounds when you breathe (wheeze).  Your symptoms do not get better with treatment.  You have thick fluid coming from your nose.  You start to have nosebleeds. Get help right away if:  Your tongue or your lips are swollen.  You have trouble breathing.  You feel dizzy or you feel like you are going to  pass out (faint).  You have cold sweats. Summary  Allergic rhinitis is a reaction to allergens in the air.  This condition may be caused by allergens. These include pollen, dust mites, pet dander, and mold.  Symptoms include a runny, itchy nose, sneezing, or tearing eyes. You may also have trouble sleeping or feel sleepy during the  day.  Treatment includes taking medicines and avoiding allergens. You may also get shots or take stronger medicines.  Get help if you have a fever or a cough that does not stop. Get help right away if you are short of breath. This information is not intended to replace advice given to you by your health care provider. Make sure you discuss any questions you have with your health care provider. Document Revised: 08/18/2018 Document Reviewed: 11/18/2017 Elsevier Patient Education  [place].

## 2019-08-23 NOTE — Progress Notes (Signed)
Subjective:     Roger Fowler is a 54 y.o. male and is here for a comprehensive physical exam. The patient reports problems - cigarette use, HAs, ear pressure.  Patient endorses sneezing, nasal congestion, ear pressure over the last few weeks.  Patient is unsure if he has allergies.  Patient denies itchy, watery eyes, sore throat, or cough.  Patient has not tried anything for his symptoms.  Patient currently smoking 1/2 pack/day.  Patient also notes recent headaches at night.  States having more tension in his neck.  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  Pt is also caring for his mother.  Pt has a history of psoriasis notes mainly on his legs.  States in the past tried Humira po?, but did not tolerate.  Colonoscopy date.  Patient endorses difficulty finding someone to take him to the appointment.  Would prefer to have Cologuard.  Social History   Socioeconomic History  . Marital status: Married    Spouse name: Not on file  . Number of children: Not on file  . Years of education: Not on file  . Highest education level: Not on file  Occupational History  . Not on file  Tobacco Use  . Smoking status: Current Every Day Smoker    Packs/day: 1.00    Years: 27.00    Pack years: 27.00    Types: Cigarettes  . Smokeless tobacco: Current User  Substance and Sexual Activity  . Alcohol use: Yes  . Drug use: No  . Sexual activity: Not on file  Other Topics Concern  . Not on file  Social History Narrative   Occupation:  Dealer for [employer]   Current Smoker   Divorced   One son age 53 - currently serving in [employer]. Deployed in [place]         Social Determinants of Health   Financial Resource Strain:   . Difficulty of Paying Living Expenses:   Food Insecurity:   . Worried About Programme researcher, broadcasting/film/video in the Last Year:   . Barista in the Last Year:   Transportation Needs:   . Freight forwarder (Medical):   Marland Kitchen Lack of Transportation  (Non-Medical):   Physical Activity:   . Days of Exercise per Week:   . Minutes of Exercise per Session:   Stress:   . Feeling of Stress :   Social Connections:   . Frequency of Communication with Friends and Family:   . Frequency of Social Gatherings with Friends and Family:   . Attends Religious Services:   . Active Member of Clubs or Organizations:   . Attends Banker Meetings:   Marland Kitchen Marital Status:   Intimate Partner Violence:   . Fear of Current or Ex-Partner:   . Emotionally Abused:   Marland Kitchen Physically Abused:   . Sexually Abused:    Health Maintenance  Topic Date Due  . COLONOSCOPY  Never done  . INFLUENZA VACCINE  12/12/2019  . TETANUS/TDAP  07/18/2027  . HIV Screening  Completed    The following portions of the patient's history were reviewed and updated as appropriate: allergies, current medications, past family history, past medical history, past social history, past surgical history and problem list.  Review of Systems Pertinent items noted in HPI and remainder of comprehensive ROS otherwise negative.   Objective:    BP 118/70 (BP Location: Left Arm, Patient Position: Sitting, Cuff Size: Large)   Pulse  92   Temp 98.1 F (36.7 C) (Temporal)   Wt 202 lb (91.6 kg)   SpO2 98%   BMI 27.40 kg/m  General appearance: alert, cooperative and no distress Head: Normocephalic, without obvious abnormality, atraumatic Eyes: conjunctivae/corneas clear. PERRL, EOM's intact. Fundi benign. Ears: Dried skin in b/l canals.  B/l TMs full. Mild Erythema on lower edege of R TM Nose: Nares normal. Septum midline. Mucosa mildly edematous. No drainage.  No sinus tenderness. Throat: lips, mucosa, and tongue normal; teeth and gums normal Neck: no adenopathy, no carotid bruit, no JVD, supple, symmetrical, trachea midline and thyroid not enlarged, symmetric, no tenderness/mass/nodules Lungs: clear to auscultation bilaterally Heart: regular rate and rhythm, S1, S2 normal, no  murmur, click, rub or gallop Abdomen: soft, non-tender; bowel sounds normal; no masses,  no organomegaly Extremities: extremities normal, atraumatic, no cyanosis or edema Pulses: 2+ and symmetric Skin: Skin color, texture, turgor normal. No rashes or lesions Thickened hyperpigmented nails of b/l hands.  Psoriasis. Lymph nodes: Cervical, supraclavicular, and axillary nodes normal. Neurologic: Alert and oriented X 3, normal strength and tone. Normal symmetric reflexes. Normal coordination and gait    Assessment:    Healthy male exam.     Plan:     Anticipatory guidance given including wearing seatbelts, smoke detectors in the home, increasing physical activity, increasing p.o. intake of water and vegetables. -will obtain labs -discussed colonoscopy.  Pt request cologaurd -given handout -next CPE in 1 yr See After Visit Summary for Counseling Recommendations    Mixed hyperlipidemia  -Continue pravastatin 40 mg -Continue lifestyle modifications -If cholesterol remains elevated discussed switching medication. - Plan: Lipid panel  Cigarette nicotine dependence without complication -Smoking cessation counseling greater than 3 minutes, less than 10 minutes -Currently smoking half pack per day -Discussed ways to quit -Given handout -We will continue to monitor - Plan: CBC with Differential/Platelet  Acute sinusitis, recurrence not specified, unspecified location -Supportive care -Discussed controlling allergy symptoms.  Will start Flonase - Plan: amoxicillin (AMOXIL) 500 MG capsule  Seasonal allergies  - Plan: fluticasone (FLONASE) 50 MCG/ACT nasal spray  Screening for prostate cancer - Plan: PSA  Screening for diabetes mellitus  - Plan: Hemoglobin A1c  Screen for colon cancer  - Plan: Cologuard  Nonintractable headache, unspecified chronicity pattern, unspecified headache type -Discussed possible causes including stress/muscle tension, dehydration, sinusitis -Okay to  take Tylenol as needed -Increase p.o. hydration -Discussed ways to reduce stress -Follow-up for continued headaches  Psoriasis -Continue follow-up with dermatology -Discussed keeping a food diary to see if certain foods such as dairy make skin worse.  Follow-up as needed  Grier Mitts, MD

## 2019-08-25 ENCOUNTER — Other Ambulatory Visit: Payer: Self-pay

## 2019-08-25 MED ORDER — PRAVASTATIN SODIUM 40 MG PO TABS: ORAL_TABLET | ORAL | 2 refills | Status: DC

## 2019-08-31 ENCOUNTER — Telehealth: Payer: Self-pay | Admitting: Family Medicine

## 2019-08-31 ENCOUNTER — Other Ambulatory Visit: Payer: Self-pay

## 2019-08-31 MED ORDER — PRAVASTATIN SODIUM 40 MG PO TABS: ORAL_TABLET | ORAL | 2 refills | Status: DC

## 2019-08-31 NOTE — Telephone Encounter (Signed)
Pt Rx for Pravastatin [facility] to [facility] per pt request

## 2019-08-31 NOTE — Telephone Encounter (Signed)
The patient needs this medication [facility] to [facility] not [facility]  pravastatin (PRAVACHOL) 40 MG tablet    [id number] - [place],  - [address] AT [facility] [address] Phone:  [phone]  Fax:  [phone]

## 2019-09-16 ENCOUNTER — Other Ambulatory Visit: Payer: Self-pay | Admitting: Family Medicine

## 2019-09-16 DIAGNOSIS — J302 Other seasonal allergic rhinitis: Secondary | ICD-10-CM

## 2020-01-13 ENCOUNTER — Telehealth (INDEPENDENT_AMBULATORY_CARE_PROVIDER_SITE_OTHER): Payer: Managed Care, Other (non HMO) | Admitting: Family Medicine

## 2020-01-13 ENCOUNTER — Encounter: Payer: Self-pay | Admitting: Family Medicine

## 2020-01-13 DIAGNOSIS — J302 Other seasonal allergic rhinitis: Secondary | ICD-10-CM

## 2020-01-13 DIAGNOSIS — F419 Anxiety disorder, unspecified: Secondary | ICD-10-CM | POA: Diagnosis not present

## 2020-01-13 DIAGNOSIS — R0602 Shortness of breath: Secondary | ICD-10-CM

## 2020-01-13 MED ORDER — FLUTICASONE PROPIONATE 50 MCG/ACT NA SUSP: 1.0000 | Freq: Every day | NASAL | 1 refills | Status: DC

## 2020-01-13 NOTE — Progress Notes (Signed)
Virtual Visit via Video Note  I connected with Roger Fowler. Roger Fowler on 01/13/20 at  2:30 PM EDT by a video enabled telemedicine application 2/2 COVID-19 pandemic and verified that I am speaking with the correct person using two identifiers.  Location patient: home Location provider:work or home office Persons participating in the virtual visit: patient, provider  I discussed the limitations of evaluation and management by telemedicine and the availability of in person appointments. The patient expressed understanding and agreed to proceed.   HPI: Pt is a 54 yo male with anxiety, psoriasis, tobacco use, HLD seen for acute concern.  Pt had his 1st dose of Pfizer COVID vaccine last Thursday.  Had dizziness a few hours after.  Got up that Friday am, had 2 cups of coffee and had a cigarette before the room started spinning and he had a panic attack.  Pt called EMS as he thought he might die.  Pt's bp was 180/?, then went down to 140/?.  Since then, pt feels ok aside from SOB.  Has chest heaviness/hard to breathe.  Pt decreased cigarette consumption to 1 per day as he was concerned.  Pt is planning to get a COVID test due to his symptoms.  Pt has some nasal congestion.  Pt had CP x 48 hrs after the episode.  Pt notes increased stress.  Pt's last panic attack was over a yr ago.  Pt took his mom's xanax.  Taking a 1/2 a pill every other day.  Inquires about having his own rx.  Pt states he may use 5 pills per yr.  ROS: See pertinent positives and negatives per HPI.  Past Medical History:  Diagnosis Date  . Arthritis   . Hyperlipidemia   . Psoriasis   . Tobacco use     No past surgical history on file.  Family History  Problem Relation Age of Onset  . Hypertension Mother   . Cancer Mother   . Hypertension Brother   . Diabetes Mellitus II Neg Hx   . Colon cancer Neg Hx   . Prostate cancer Neg Hx       Current Outpatient Medications:  .  fluticasone (FLONASE) 50 MCG/ACT nasal spray, PLACE 1  SPRAY INTO BOTH NOSTRILS DAILY., Disp: 16 g, Rfl: 1 .  pravastatin (PRAVACHOL) 40 MG tablet, TAKE 1 TABLET (40 MG TOTAL) BY MOUTH DAILY., Disp: 90 tablet, Rfl: 2  EXAM:  VITALS per patient if applicable:  RR between 12-20 bpm  GENERAL: alert, oriented, appears well and in no acute distress  HEENT: atraumatic, conjunctiva clear, no obvious abnormalities on inspection of external nose and ears  NECK: normal movements of the head and neck  LUNGS: on inspection no signs of respiratory distress, breathing rate appears normal, no obvious gross SOB, gasping or wheezing  CV: no obvious cyanosis  MS: moves all visible extremities without noticeable abnormality  PSYCH/NEURO: pleasant and cooperative, no obvious depression or anxiety, speech and thought processing grossly intact  ASSESSMENT AND PLAN:  Discussed the following assessment and plan:  SOB (shortness of breath)  -Discussed possible causes including anxiety, infection, COPD, reactive airway disease, CHF, A. fib -Offered referral to cardiology for stress testing, however patient declines at this time. -We will obtain labs and CXR - Plan: DG Chest 2 View, D-dimer, Quantitative, CBC with Differential/Platelet  Anxiety  -GAD-7 score 16 -Patient encouraged to consider counseling however declines at this time. -Discussed medication options.  Pt advised may benefit from a longer acting medication as opposed  to Xanax.   -Tried hydroxyzine in the past however it caused increased anxiety. - Plan: CBC with Differential/Platelet, T4, free, TSH  Seasonal allergies  - Plan: fluticasone (FLONASE) 50 MCG/ACT nasal spray  Follow-up in person in the next few weeks   I discussed the assessment and treatment plan with the patient. The patient was provided an opportunity to ask questions and all were answered. The patient agreed with the plan and demonstrated an understanding of the instructions.   The patient was advised to call back or seek  an in-person evaluation if the symptoms worsen or if the condition fails to improve as anticipated.  I provided 30 minutes of non-face-to-face time during this encounter.   Deeann Saint, MD

## 2020-01-14 ENCOUNTER — Ambulatory Visit (INDEPENDENT_AMBULATORY_CARE_PROVIDER_SITE_OTHER)
Admission: RE | Admit: 2020-01-14 | Discharge: 2020-01-14 | Disposition: A | Discharge status: Home / Self Care | Payer: Managed Care, Other (non HMO) | Source: Ambulatory Visit | Attending: Family Medicine | Admitting: Family Medicine

## 2020-01-14 ENCOUNTER — Other Ambulatory Visit (INDEPENDENT_AMBULATORY_CARE_PROVIDER_SITE_OTHER): Payer: Managed Care, Other (non HMO)

## 2020-01-14 ENCOUNTER — Other Ambulatory Visit: Payer: Managed Care, Other (non HMO)

## 2020-01-14 ENCOUNTER — Other Ambulatory Visit: Payer: Self-pay

## 2020-01-14 DIAGNOSIS — R0602 Shortness of breath: Secondary | ICD-10-CM | POA: Diagnosis not present

## 2020-01-14 DIAGNOSIS — F419 Anxiety disorder, unspecified: Secondary | ICD-10-CM | POA: Diagnosis not present

## 2020-01-14 DIAGNOSIS — Z20822 Contact with and (suspected) exposure to covid-19: Secondary | ICD-10-CM

## 2020-01-14 LAB — CBC WITH DIFFERENTIAL/PLATELET
Basophils Absolute: 0 10*3/uL (ref 0.0–0.1)
Basophils Relative: 0.4 % (ref 0.0–3.0)
Eosinophils Absolute: 0 10*3/uL (ref 0.0–0.7)
Eosinophils Relative: 0.3 % (ref 0.0–5.0)
HCT: 41.9 % (ref 39.0–52.0)
Hemoglobin: 13.9 g/dL (ref 13.0–17.0)
Lymphocytes Relative: 27.8 % (ref 12.0–46.0)
Lymphs Abs: 1.6 10*3/uL (ref 0.7–4.0)
MCHC: 33.3 g/dL (ref 30.0–36.0)
MCV: 83.9 fl (ref 78.0–100.0)
Monocytes Absolute: 0.5 10*3/uL (ref 0.1–1.0)
Monocytes Relative: 8.7 % (ref 3.0–12.0)
Neutro Abs: 3.7 10*3/uL (ref 1.4–7.7)
Neutrophils Relative %: 62.8 % (ref 43.0–77.0)
Platelets: 190 10*3/uL (ref 150.0–400.0)
RBC: 5 Mil/uL (ref 4.22–5.81)
RDW: 14.7 % (ref 11.5–15.5)
WBC: 5.8 10*3/uL (ref 4.0–10.5)

## 2020-01-14 LAB — T4, FREE: Free T4: 0.82 ng/dL (ref 0.60–1.60)

## 2020-01-14 LAB — TSH: TSH: 1.08 u[IU]/mL (ref 0.35–4.50)

## 2020-01-15 LAB — D-DIMER, QUANTITATIVE: D-Dimer, Quant: 0.29 mcg/mL FEU (ref <0.50)

## 2020-01-16 LAB — NOVEL CORONAVIRUS, NAA: SARS-CoV-2, NAA: NOT DETECTED

## 2020-01-19 ENCOUNTER — Encounter: Payer: Self-pay | Admitting: Family Medicine

## 2020-07-14 ENCOUNTER — Other Ambulatory Visit: Payer: Self-pay | Admitting: Family Medicine

## 2020-11-03 ENCOUNTER — Ambulatory Visit
Admission: EM | Admit: 2020-11-03 | Discharge: 2020-11-03 | Disposition: A | Discharge status: Home / Self Care | Payer: Managed Care, Other (non HMO) | Attending: Internal Medicine | Admitting: Internal Medicine

## 2020-11-03 ENCOUNTER — Other Ambulatory Visit: Payer: Self-pay

## 2020-11-03 ENCOUNTER — Encounter: Payer: Self-pay | Admitting: Emergency Medicine

## 2020-11-03 DIAGNOSIS — L308 Other specified dermatitis: Secondary | ICD-10-CM

## 2020-11-03 HISTORY — DX: Dermatitis, unspecified: L30.9

## 2020-11-03 MED ORDER — PREDNISONE 20 MG PO TABS: 20.0000 mg | ORAL_TABLET | Freq: Two times a day (BID) | ORAL | 0 refills | Status: AC

## 2020-11-03 MED ORDER — DOXYCYCLINE HYCLATE 100 MG PO CAPS: 100.0000 mg | ORAL_CAPSULE | Freq: Two times a day (BID) | ORAL | 0 refills | Status: AC

## 2020-11-03 NOTE — Discharge Instructions (Signed)
Please follow up with dermatologist  in 3 days on scheduled appointment on 10/06/20 for further evaluation and management.   Continue to use moisturizing agents to hands bilaterally.   Use warm compresses to affected area of buttocks. Appears to have folliculitis which is an infection of the hair follicle that should resolve with antibiotic treatment and warm compresses. Follow up if no resolution of symptoms with current treatment plan.

## 2020-11-03 NOTE — ED Triage Notes (Addendum)
Patient c/o eczema and psoriasis exacerbation x 3-4 days.   Patient endorses symptoms on bilateral hands.   Patient has history of eczema and psoriasis for the past 2 years.   Patient endorses itching. Patient endorses redness and " cracking of hands".   Patient states " I noticed my skin has been coming off".   Patient endorses bilateral nail fungus. Patient endorses jock itch , " I think my nail fungus has spread to my scrotum".   Patient has used Vaseline and Topical Steroid with no relief of symptoms.

## 2020-11-03 NOTE — ED Provider Notes (Addendum)
[facility]    CSN: [id number] Arrival date & time: 11/03/20  1559      History   Chief Complaint Chief Complaint  Patient presents with   Eczema   Psoriasis    HPI Roger Fowler is a 55 y.o. male.   Patient presents with itching, inflammation, redness, and mild pain to bilateral hands that has been present and worsening for about 3-4 days. Has a history of eczema and psoriasis where he is closely followed by a dermatologist. Last visit with dermatologist was about 5 months ago per patient. Has scheduled appointment with dermatologist in 3 days on 10/06/20 for current chief complaint but states that he needed some relief of symptoms before then. Has been dealing with psoriasis and eczema for multiple years with intermittent flare ups. Has been treated with "prescription cream" that he is not sure the name of that usually results in resolution or improvement in skin. Has been using this cream that he had leftover for his skin currently but he has not seen any resolution or improvement in symptoms this time. He has also been using moisturizing creams routinely such as Vaseline to help with skin dryness and prevention of eczema complications. Patient has noticed that skin on hands has become increasingly red, cracked, and itchy over the past few days. Has also noticed some small red dots on his skin on bilateral forearms that started yesterday. Has a nail fungus that he has also been dealing with for quite some time on bilateral hands that his dermatologist has treated with "creams and pills."   Patient is also presenting with a lesion to his buttocks that he noticed a few days ago that "popped" yesterday and drained. States that it is irritated and burning in between his butt cheeks. Denies any exposure to STD or any risky sexual behavior. Denies itchiness or redness in buttocks or groin.    Psoriasis   Past Medical History:  Diagnosis Date   Arthritis    Eczema     Hyperlipidemia    Psoriasis    Tobacco use     Patient Active Problem List   Diagnosis Date Noted   Back pain 09/09/2014   Neck pain 09/09/2014   Preventative health care 12/10/2013   Change in bowel habits 12/10/2013   TOBACCO USE 01/09/2010   PSORIASIS 06/12/2007    History reviewed. No pertinent surgical history.     Home Medications    Prior to Admission medications   Medication Sig Start Date End Date Taking? Authorizing Provider  doxycycline (VIBRAMYCIN) 100 MG capsule Take 1 capsule (100 mg total) by mouth 2 (two) times daily for 10 days. 11/03/20 11/13/20 Yes Lance Muss, FNP  pravastatin (PRAVACHOL) 40 MG tablet TAKE 1 TABLET BY MOUTH EVERY DAY 07/14/20  Yes Deeann Saint, MD  predniSONE (DELTASONE) 20 MG tablet Take 1 tablet (20 mg total) by mouth in the morning and at bedtime for 5 days. 11/03/20 11/08/20 Yes Lance Muss, FNP  fluticasone (FLONASE) 50 MCG/ACT nasal spray Place 1 spray into both nostrils daily. 01/13/20   Deeann Saint, MD    Family History Family History  Problem Relation Age of Onset   Hypertension Mother    Cancer Mother    Hypertension Brother    Diabetes Mellitus II Neg Hx    Colon cancer Neg Hx    Prostate cancer Neg Hx     Social History Social History   Tobacco Use   Smoking status: Every  Day    Packs/day: 1.00    Years: 27.00    Pack years: 27.00    Types: Cigarettes   Smokeless tobacco: Current  Substance Use Topics   Alcohol use: Yes   Drug use: No     Allergies   Patient has no known allergies.   Review of Systems Review of Systems Per HPI  Physical Exam Triage Vital Signs ED Triage Vitals  Enc Vitals Group     BP 11/03/20 1711 (!) 154/92     Pulse Rate 11/03/20 1711 88     Resp 11/03/20 1711 15     Temp 11/03/20 1711 98.3 F (36.8 C)     Temp Source 11/03/20 1711 Oral     SpO2 11/03/20 1711 97 %     Weight --      Height --      Head Circumference --      Peak Flow --      Pain Score 11/03/20  1708 8     Pain Loc --      Pain Edu? --      Excl. in GC? --    No data found.  Updated Vital Signs BP (!) 154/92 (BP Location: Left Arm)   Pulse 88   Temp 98.3 F (36.8 C) (Oral)   Resp 15   SpO2 97%   Visual Acuity Right Eye Distance:   Left Eye Distance:   Bilateral Distance:    Right Eye Near:   Left Eye Near:    Bilateral Near:     Physical Exam Constitutional:      Appearance: Normal appearance.  HENT:     Head: Normocephalic and atraumatic.  Eyes:     Extraocular Movements: Extraocular movements intact.     Conjunctiva/sclera: Conjunctivae normal.  Pulmonary:     Effort: Pulmonary effort is normal.  Skin:    General: Skin is warm.     Findings: Erythema and rash present. Rash is scaling.     Comments: Redness, swelling, and cracking to skin on bilateral dorsal surface of hands that extends to wrists. Pinpoint, raised, red lesions scattered throughout bilateral forearms.   Flat, open, erythematous area approximately 0.5 cm in width located to inner right buttocks approximately 2 cm from rectum that appears to be drained abscess from infected hair follicle. Other staff member present during physical exam.  Black and yellow discoloration to all 10 nails.   Neurological:     General: No focal deficit present.     Mental Status: He is alert and oriented to person, place, and time. Mental status is at baseline.  Psychiatric:        Mood and Affect: Mood normal.        Behavior: Behavior normal.        Thought Content: Thought content normal.        Judgment: Judgment normal.     UC Treatments / Results  Labs (all labs ordered are listed, but only abnormal results are displayed) Labs Reviewed - No data to display  EKG   Radiology No results found.  Procedures Procedures (including critical care time)  Medications Ordered in UC Medications - No data to display  Initial Impression / Assessment and Plan / UC Course  I have reviewed the triage vital  signs and the nursing notes.  Pertinent labs & imaging results that were available during my care of the patient were reviewed by me and considered in my medical decision making (see chart for  details).     Expressed the importance of follow up with dermatologist on 10/06/20 for further evaluation and management of exacerbation of chronic skin condition. Patient voiced understanding. Will prescribe steroid to attempt to decrease inflammation and swelling present in bilateral hands. Patient to continue moisturizing agents on hands. Follow up with dermatologist for further and continued management of nail fungal infection. Not suspicious of fungal infection of hands due to physical exam of hands.   Will treat suspected folliculitis abscess to buttocks with antibiotics and warm compresses. Patient to follow up if no resolution of skin or discomfort with treatment plan.   Discussed strict return precautions. Patient verbalized understanding and is agreeable with plan.  Final Clinical Impressions(s) / UC Diagnoses   Final diagnoses:  Other eczema     Discharge Instructions      Please follow up with dermatologist  in 3 days on scheduled appointment on 10/06/20 for further evaluation and management.   Continue to use moisturizing agents to hands bilaterally.   Use warm compresses to affected area of buttocks. Appears to have folliculitis which is an infection of the hair follicle that should resolve with antibiotic treatment and warm compresses. Follow up if no resolution of symptoms with current treatment plan.    ED Prescriptions     Medication Sig Dispense Auth. Provider   predniSONE (DELTASONE) 20 MG tablet Take 1 tablet (20 mg total) by mouth in the morning and at bedtime for 5 days. 10 tablet Lance Muss, FNP   doxycycline (VIBRAMYCIN) 100 MG capsule Take 1 capsule (100 mg total) by mouth 2 (two) times daily for 10 days. 20 capsule Lance Muss, FNP      PDMP not reviewed  this encounter.   Lance Muss, FNP 11/03/20 1817    Lance Muss, FNP 11/03/20 1819    Lance Muss, FNP 11/03/20 1820

## 2021-01-30 ENCOUNTER — Encounter: Payer: Self-pay | Admitting: Family Medicine

## 2021-01-30 ENCOUNTER — Telehealth (INDEPENDENT_AMBULATORY_CARE_PROVIDER_SITE_OTHER): Payer: Managed Care, Other (non HMO) | Admitting: Family Medicine

## 2021-01-30 DIAGNOSIS — R0981 Nasal congestion: Secondary | ICD-10-CM | POA: Diagnosis not present

## 2021-01-30 MED ORDER — AMOXICILLIN-POT CLAVULANATE 875-125 MG PO TABS: 1.0000 | ORAL_TABLET | Freq: Two times a day (BID) | ORAL | 0 refills | Status: DC

## 2021-01-30 NOTE — Patient Instructions (Addendum)
-  I sent the medication(s) we discussed to your pharmacy: Meds ordered this encounter  Medications   amoxicillin-clavulanate (AUGMENTIN) 875-125 MG tablet    Sig: Take 1 tablet by mouth 2 (two) times daily.    Dispense:  20 tablet    Refill:  0   Nasal saline twice daily  Afrin for 3 days. Do not use longer than 3 days.  I hope you are feeling better soon!  Seek in person care promptly if your symptoms worsen, new concerns arise or you are not improving with treatment.  It was nice to meet you today. I help [facility] out with telemedicine visits on Tuesdays and Thursdays and am available for visits on those days. If you have any concerns or questions following this visit please schedule a follow up visit with your Primary Care doctor or seek care at a local urgent care clinic to avoid delays in care.

## 2021-01-30 NOTE — Progress Notes (Signed)
Virtual Visit via Video Note  I connected with Jarion  on 01/30/21 at 12:40 PM EDT by a video enabled telemedicine application and verified that I am speaking with the correct person using two identifiers.  Location patient: home, [place] Location provider:work or home office Persons participating in the virtual visit: patient, provider  I discussed the limitations of evaluation and management by telemedicine and the availability of in person appointments. The patient expressed understanding and agreed to proceed.   HPI:  Acute telemedicine visit for sinus issues: -Onset: about 1 week ago -Symptoms include: congestion, sinus pressure and sinus headache, now discolored mucus -Denies:fevers, CP, SOB, NVD, body aches, inability to eat/drink/get out of bed -Has tried: atrovent -Pertinent past medical history: see below -Pertinent medication allergies: No Known Allergies -COVID-19 vaccine status: had one dose but had side effects  ROS: See pertinent positives and negatives per HPI.  Past Medical History:  Diagnosis Date   Arthritis    Eczema    Hyperlipidemia    Psoriasis    Tobacco use     History reviewed. No pertinent surgical history.   Current Outpatient Medications:    amoxicillin-clavulanate (AUGMENTIN) 875-125 MG tablet, Take 1 tablet by mouth 2 (two) times daily., Disp: 20 tablet, Rfl: 0   fluticasone (FLONASE) 50 MCG/ACT nasal spray, Place 1 spray into both nostrils daily., Disp: 16 g, Rfl: 1   ipratropium (ATROVENT) 0.03 % nasal spray, Place 2 sprays into both nostrils 3 (three) times daily., Disp: , Rfl:    pravastatin (PRAVACHOL) 40 MG tablet, TAKE 1 TABLET BY MOUTH EVERY DAY, Disp: 90 tablet, Rfl: 2  EXAM:  VITALS per patient if applicable:  GENERAL: alert, oriented, appears well and in no acute distress  HEENT: atraumatic, conjunttiva clear, no obvious abnormalities on inspection of external nose and ears  NECK: normal movements of the head and neck  LUNGS: on  inspection no signs of respiratory distress, breathing rate appears normal, no obvious gross SOB, gasping or wheezing  CV: no obvious cyanosis  MS: moves all visible extremities without noticeable abnormality  PSYCH/NEURO: pleasant and cooperative, no obvious depression or anxiety, speech and thought processing grossly intact  ASSESSMENT AND PLAN:  Discussed the following assessment and plan:  Nasal congestion  -we discussed possible serious and likely etiologies, options for evaluation and workup, limitations of telemedicine visit vs in person visit, treatment, treatment risks and precautions. Pt is agreeable to treatment via telemedicine at this moment. Query VURI, Covid19 vs other. He seemed to think he may need an abx. Discussed risks/benefits/indications for abx. He opted to try nasal saline and short course of nasal decongestant first with abx if worsening or not improving.   Advised to seek prompt in person care if worsening, new symptoms arise, or if is not improving with treatment. Discussed options for inperson care if PCP office not available. Did let this patient know that I only do telemedicine on Tuesdays and Thursdays for . Advised to schedule follow up visit with PCP or [facility] if any further questions or concerns to avoid delays in care.   I discussed the assessment and treatment plan with the patient. The patient was provided an opportunity to ask questions and all were answered. The patient agreed with the plan and demonstrated an understanding of the instructions.     Terressa Koyanagi, DO

## 2021-04-18 ENCOUNTER — Encounter: Payer: Self-pay | Admitting: Family Medicine

## 2021-04-18 ENCOUNTER — Telehealth (INDEPENDENT_AMBULATORY_CARE_PROVIDER_SITE_OTHER): Payer: Managed Care, Other (non HMO) | Admitting: Family Medicine

## 2021-04-18 DIAGNOSIS — J40 Bronchitis, not specified as acute or chronic: Secondary | ICD-10-CM | POA: Diagnosis not present

## 2021-04-18 DIAGNOSIS — E782 Mixed hyperlipidemia: Secondary | ICD-10-CM

## 2021-04-18 MED ORDER — BENZONATATE 100 MG PO CAPS: 100.0000 mg | ORAL_CAPSULE | Freq: Two times a day (BID) | ORAL | 0 refills | Status: DC | PRN

## 2021-04-18 MED ORDER — ALBUTEROL SULFATE HFA 108 (90 BASE) MCG/ACT IN AERS: 2.0000 | INHALATION_SPRAY | Freq: Four times a day (QID) | RESPIRATORY_TRACT | 0 refills | Status: DC | PRN

## 2021-04-18 MED ORDER — PRAVASTATIN SODIUM 40 MG PO TABS: ORAL_TABLET | ORAL | 0 refills | Status: DC

## 2021-04-18 MED ORDER — PREDNISONE 10 MG PO TABS: ORAL_TABLET | ORAL | 0 refills | Status: DC

## 2021-04-18 NOTE — Progress Notes (Signed)
Virtual Visit via Video Note  I connected withNAME@ on 04/18/21 at  2:30 PM EST by a video enabled telemedicine application 2/2 COVID-19 pandemic and verified that I am speaking with the correct person using two identifiers.  Location patient: home Location provider:work or home office Persons participating in the virtual visit: patient, provider  I discussed the limitations of evaluation and management by telemedicine and the availability of in person appointments. The patient expressed understanding and agreed to proceed.  Chief Complaint  Patient presents with   Nasal Congestion    Had flu on 11/29, states flu is gone and feels better, but has chest congestion and a cough. Has taken Dayquil anf Nyquil and it helped. Went back to work on Monday       HPI: Pt is a 55 year old male with pmh sig for HLD, arthritis, eczema, psoriasis, tobacco use seen for ongoing concern. Pt got the flu from his granddaughter around Thanksgiving.  Pt feeling mostly better.  Started work on Monday.  Having chest congestion, productive cough, nasal congestion, sinus pressure, scratchy throat, SOB with exertion.  Unable to smoke a cigarette due to the SOB.  Denies sinus pain, HA, sore throat, fever, ear pain/press.  Taking dayquil, nyquil.  Pt requesting refill on cholesterol med.  Plans to schedule a physical.  ROS: See pertinent positives and negatives per HPI.  Past Medical History:  Diagnosis Date   Arthritis    Eczema    Hyperlipidemia    Psoriasis    Tobacco use     No past surgical history on file.  Family History  Problem Relation Age of Onset   Hypertension Mother    Cancer Mother    Hypertension Brother    Diabetes Mellitus II Neg Hx    Colon cancer Neg Hx    Prostate cancer Neg Hx      Current Outpatient Medications:    fluticasone (FLONASE) 50 MCG/ACT nasal spray, Place 1 spray into both nostrils daily., Disp: 16 g, Rfl: 1   ipratropium (ATROVENT) 0.03 % nasal spray, Place 2  sprays into both nostrils 3 (three) times daily., Disp: , Rfl:    pravastatin (PRAVACHOL) 40 MG tablet, TAKE 1 TABLET BY MOUTH EVERY DAY, Disp: 90 tablet, Rfl: 2   amoxicillin-clavulanate (AUGMENTIN) 875-125 MG tablet, Take 1 tablet by mouth 2 (two) times daily. (Patient not taking: Reported on 04/18/2021), Disp: 20 tablet, Rfl: 0  EXAM:  VITALS per patient if applicable:RR between 12-20 bpm  GENERAL: alert, oriented, appears well and in no acute distress  HEENT: atraumatic, conjunctiva clear, no obvious abnormalities on inspection of external nose and ears  NECK: normal movements of the head and neck  LUNGS: on inspection no signs of respiratory distress, breathing rate appears normal, no obvious gross SOB, gasping or wheezing  CV: no obvious cyanosis  MS: moves all visible extremities without noticeable abnormality  PSYCH/NEURO: pleasant and cooperative, no obvious depression or anxiety, speech and thought processing grossly intact  ASSESSMENT AND PLAN:  Discussed the following assessment and plan:  Bronchitis  -likely post viral cough.  Also consider pna -Supportive care including rest, hydration, steam from shower, OTC Flonase -Patient encouraged to stop smoking -For continued symptoms CXR -Given strict precautions - Plan: albuterol (VENTOLIN HFA) 108 (90 Base) MCG/ACT inhaler, predniSONE (DELTASONE) 10 MG tablet, benzonatate (TESSALON) 100 MG capsule  Mixed hyperlipidemia  - Plan: pravastatin (PRAVACHOL) 40 MG tablet  Follow-up as needed.  Advised to schedule CPE.   I discussed the assessment and  treatment plan with the patient. The patient was provided an opportunity to ask questions and all were answered. The patient agreed with the plan and demonstrated an understanding of the instructions.   The patient was advised to call back or seek an in-person evaluation if the symptoms worsen or if the condition fails to improve as anticipated.  Deeann Saint, MD

## 2021-08-30 ENCOUNTER — Encounter: Payer: Managed Care, Other (non HMO) | Admitting: Family Medicine

## 2021-09-14 ENCOUNTER — Ambulatory Visit (INDEPENDENT_AMBULATORY_CARE_PROVIDER_SITE_OTHER): Payer: Managed Care, Other (non HMO) | Admitting: Family Medicine

## 2021-09-14 ENCOUNTER — Encounter: Payer: Self-pay | Admitting: Family Medicine

## 2021-09-14 VITALS — BP 145/91 | HR 83 | Temp 98.7°F | Ht 72.0 in | Wt 215.8 lb

## 2021-09-14 DIAGNOSIS — H6121 Impacted cerumen, right ear: Secondary | ICD-10-CM | POA: Diagnosis not present

## 2021-09-14 DIAGNOSIS — E782 Mixed hyperlipidemia: Secondary | ICD-10-CM

## 2021-09-14 DIAGNOSIS — F419 Anxiety disorder, unspecified: Secondary | ICD-10-CM | POA: Diagnosis not present

## 2021-09-14 DIAGNOSIS — F17210 Nicotine dependence, cigarettes, uncomplicated: Secondary | ICD-10-CM

## 2021-09-14 DIAGNOSIS — R030 Elevated blood-pressure reading, without diagnosis of hypertension: Secondary | ICD-10-CM

## 2021-09-14 DIAGNOSIS — Z0000 Encounter for general adult medical examination without abnormal findings: Secondary | ICD-10-CM

## 2021-09-14 DIAGNOSIS — Z0001 Encounter for general adult medical examination with abnormal findings: Secondary | ICD-10-CM | POA: Diagnosis not present

## 2021-09-14 DIAGNOSIS — Z125 Encounter for screening for malignant neoplasm of prostate: Secondary | ICD-10-CM

## 2021-09-14 DIAGNOSIS — Z1211 Encounter for screening for malignant neoplasm of colon: Secondary | ICD-10-CM

## 2021-09-14 DIAGNOSIS — H6981 Other specified disorders of Eustachian tube, right ear: Secondary | ICD-10-CM

## 2021-09-14 MED ORDER — ALPRAZOLAM 0.5 MG PO TABS: 0.5000 mg | ORAL_TABLET | Freq: Two times a day (BID) | ORAL | 0 refills | Status: DC | PRN

## 2021-09-14 MED ORDER — FLUTICASONE PROPIONATE 50 MCG/ACT NA SUSP: 2.0000 | Freq: Every day | NASAL | 6 refills | Status: DC

## 2021-09-14 MED ORDER — PRAVASTATIN SODIUM 40 MG PO TABS: ORAL_TABLET | ORAL | 3 refills | Status: DC

## 2021-09-14 NOTE — Progress Notes (Signed)
Subjective:  ?  ? Roger Fowler is a 56 y.o. male and is here for a comprehensive physical exam. The patient reports recent episodes of panic attacks.  Patient using alprazolam from his mother.  Almost lost his job after a random screening.  Pt requesting rx for alprazolam.  Patient notes increased stress at work.  Patient still smoking cigarettes.  Allergy symptoms have not been as bad this year.  Endorses a muffled sound in right ear.  At times has right-sided neck pain/discomfort noted more when laying on right side to sleep.  Pt notes improvement in psoriasis after seeing dermatology.  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  Patient states he plans on getting a colonoscopy this year. ? ?Patient states his mother is doing well and is now in remission. ? ?Social History  ? ?Socioeconomic History  ? Marital status: Married  ?  Spouse name: Not on file  ? Number of children: Not on file  ? Years of education: Not on file  ? Highest education level: Not on file  ?Occupational History  ? Not on file  ?Tobacco Use  ? Smoking status: Every Day  ?  Packs/day: 1.00  ?  Years: 27.00  ?  Pack years: 27.00  ?  Types: Cigarettes  ? Smokeless tobacco: Current  ?Substance and Sexual Activity  ? Alcohol use: Yes  ? Drug use: No  ? Sexual activity: Not on file  ?Other Topics Concern  ? Not on file  ?Social History Narrative  ? Occupation:  DealerQuality Controller for [employer]  ? Current Smoker  ? Divorced  ? One son age 55 - currently serving in [employer]. Deployed in [place]  ?   ?   ? ?Social Determinants of Health  ? ?Financial Resource Strain: Not on file  ?Food Insecurity: Not on file  ?Transportation Needs: Not on file  ?Physical Activity: Not on file  ?Stress: Not on file  ?Social Connections: Not on file  ?Intimate Partner Violence: Not on file  ? ?Health Maintenance  ?Topic Date Due  ? Hepatitis C Screening  Never done  ? COLONOSCOPY (Pts [id number] Insurance coverage will need to be  confirmed)  Never done  ? Zoster Vaccines- Shingrix (1 of 2) Never done  ? COVID-19 Vaccine (2 - Pfizer series) 09/30/2021 (Originally 05/22/2020)  ? INFLUENZA VACCINE  12/11/2021  ? TETANUS/TDAP  07/18/2027  ? HIV Screening  Completed  ? HPV VACCINES  Aged Out  ? ? ?The following portions of the patient's history were reviewed and updated as appropriate: allergies, current medications, past family history, past medical history, past social history, past surgical history, and problem list. ? ?Review of Systems ?Pertinent items noted in HPI and remainder of comprehensive ROS otherwise negative.  ? ?Objective:  ? ? BP (!) 145/91 (BP Location: Left Arm, Patient Position: Sitting, Cuff Size: Normal)   Pulse 83   Temp 98.7 ?F (37.1 ?C) (Oral)   Ht 6' (1.829 m)   Wt 215 lb 12.8 oz (97.9 kg)   SpO2 100%   BMI 29.27 kg/m?  ?General appearance: alert, cooperative, and no distress ?Head: Normocephalic, without obvious abnormality, atraumatic ?Eyes: conjunctivae/corneas clear. PERRL, EOM's intact. Fundi benign. ?Ears: normal external ears.  Left TM normal.  Dried crusty cerumen in right canal with cerumen debris on TM.  Right ear irrigated.  Right TM dull, flat. ?Nose: Nares normal. Septum midline. Mucosa normal. No drainage or sinus tenderness. ?  Throat: lips, mucosa, and tongue normal; teeth and gums normal ?Neck: no adenopathy, no carotid bruit, no JVD, supple, symmetrical, trachea midline, and thyroid not enlarged, symmetric, no tenderness/mass/nodules ?Lungs: clear to auscultation bilaterally ?Heart: regular rate and rhythm, S1, S2 normal, no murmur, click, rub or gallop ?Abdomen: soft, non-tender; bowel sounds normal; no masses,  no organomegaly ?Extremities: extremities normal, atraumatic, no cyanosis or edema ?Pulses: 2+ and symmetric ?Skin: Skin color, texture, turgor normal. No rashes or lesions.  Fingernails hypertrophic.  Small plaque of psoriasis on right outer ear. ?Lymph nodes: Cervical, supraclavicular, and  axillary nodes normal. ?Neurologic: Alert and oriented X 3, normal strength and tone. Normal symmetric reflexes. Normal coordination and gait  ?  ? ?  09/14/2021  ?  3:21 PM 07/17/2017  ?  2:45 PM  ?Depression screen PHQ 2/9  ?Decreased Interest 0 1  ?Down, Depressed, Hopeless 0 0  ?PHQ - 2 Score 0 1  ?Altered sleeping 1 0  ?Tired, decreased energy 1 1  ?Change in appetite 1 0  ?Feeling bad or failure about yourself  0 0  ?Trouble concentrating 0 0  ?Moving slowly or fidgety/restless 0 1  ?Suicidal thoughts 0 0  ?PHQ-9 Score 3 3  ?Difficult doing work/chores Not difficult at all   ? ? ?  09/14/2021  ?  4:16 PM 01/13/2020  ?  2:44 PM 07/17/2017  ?  2:45 PM  ?GAD 7 : Generalized Anxiety Score  ?Nervous, Anxious, on Edge 1 2 1   ?Control/stop worrying 1 2 2   ?Worry too much - different things 3 2 2   ?Trouble relaxing 2 3 1   ?Restless 1 3 2   ?Easily annoyed or irritable 1 2 2   ?Afraid - awful might happen 0 2 0  ?Total GAD 7 Score 9 16 10   ?Anxiety Difficulty Not difficult at all    ? ? ?Assessment:  ? ? Healthy male exam with increased panic attacks.    ?  ?Plan:  ? ? Anticipatory guidance given including wearing seatbelts, smoke detectors in the home, increasing physical activity, increasing p.o. intake of water and vegetables. ?-labs ?-Colonoscopy reordered. ?-Immunizations reviewed ?-Given handout ?-Next CPE  in 1 year ?See After Visit Summary for Counseling Recommendations  ? ?Anxiety  ?-PHQ-9 score 9 this visit ?-Discussed counseling options as it has been shown counseling and medication go better together than separately.   ?-given info on area providers ?-Continue techniques to decrease stress and anxiety ?-Continue self-care ?-Limited Rx of Xanax 0.5 mg sent to pharmacy. ?-Continue to monitor ?- Plan: ALPRAZolam (XANAX) 0.5 MG tablet, CBC with Differential/Platelet, TSH, T4, Free ? ?Dysfunction of right eustachian tube ?-Likely 2/2 allergies ?- Plan: fluticasone (FLONASE) 50 MCG/ACT nasal spray ? ?Cigarette nicotine  dependence without complication ?-Smoking cessation counseling greater than 3 minutes, less than 10 minutes ?-Continue to encourage ?-Given handout ?- Plan: CMP, CBC with Differential/Platelet, Lipid panel ? ?Mixed hyperlipidemia ?-Continue pravastatin 40 mg ?-Lifestyle modifications ?-For continued elevation discussed switching statin ?- Plan: Hemoglobin A1c, Lipid panel, pravastatin (PRAVACHOL) 40 MG tablet ? ?Screening for prostate cancer  ?- Plan: PSA ? ?Screen for colon cancer ?-Patient plans to schedule later in the year ?- Plan: Ambulatory referral to Gastroenterology ? ?Elevated blood pressure reading without diagnosis of hypertension ?-We will check BP ?-Lifestyle modifications ?-For consistent elevations greater than 140/90 start medication ? ?Cerumen debris on tympanic membrane of right ear ?-Consent obtained.  Right ear irrigated 2/2 pt concern for sensation in ear.  Patient tolerated procedure well. ? ?  F/u in 3 months, sooner if needed ? ?Abbe Amsterdam, MD ? ?

## 2021-09-14 NOTE — Patient Instructions (Signed)
Behavioral Health Services: -to make an appointment contact the office/provider you are interested in seeing.  No referral is needed.  The below is not an all inclusive list, but will help you get started.  [email or web address] -counseling located off of [address]  [email or web address] -website helps you find providers in your area  [facility] -Located off of [address] across from [employer]  Dr. Akintayo is a Psychiatrist with [place]. [phone]  [facility]  [facility]  -[address]  [phone] -a place in town that has counseling and Psychiatry services.    

## 2021-09-15 LAB — COMPREHENSIVE METABOLIC PANEL
AG Ratio: 1.6 (calc) (ref 1.0–2.5)
ALT: 41 U/L (ref 9–46)
AST: 36 U/L — ABNORMAL HIGH (ref 10–35)
Albumin: 4.5 g/dL (ref 3.6–5.1)
Alkaline phosphatase (APISO): 83 U/L (ref 35–144)
BUN: 10 mg/dL (ref 7–25)
CO2: 24 mmol/L (ref 20–32)
Calcium: 9.5 mg/dL (ref 8.6–10.3)
Chloride: 107 mmol/L (ref 98–110)
Creat: 1.02 mg/dL (ref 0.70–1.30)
Globulin: 2.9 g/dL (calc) (ref 1.9–3.7)
Glucose, Bld: 85 mg/dL (ref 65–99)
Potassium: 4.5 mmol/L (ref 3.5–5.3)
Sodium: 140 mmol/L (ref 135–146)
Total Bilirubin: 0.6 mg/dL (ref 0.2–1.2)
Total Protein: 7.4 g/dL (ref 6.1–8.1)

## 2021-09-15 LAB — CBC WITH DIFFERENTIAL/PLATELET
Absolute Monocytes: 569 cells/uL (ref 200–950)
Basophils Absolute: 50 cells/uL (ref 0–200)
Basophils Relative: 0.7 %
Eosinophils Absolute: 29 cells/uL (ref 15–500)
Eosinophils Relative: 0.4 %
HCT: 46.5 % (ref 38.5–50.0)
Hemoglobin: 15.7 g/dL (ref 13.2–17.1)
Lymphs Abs: 2030 cells/uL (ref 850–3900)
MCH: 28.2 pg (ref 27.0–33.0)
MCHC: 33.8 g/dL (ref 32.0–36.0)
MCV: 83.5 fL (ref 80.0–100.0)
MPV: 10.4 fL (ref 7.5–12.5)
Monocytes Relative: 7.9 %
Neutro Abs: 4522 cells/uL (ref 1500–7800)
Neutrophils Relative %: 62.8 %
Platelets: 200 10*3/uL (ref 140–400)
RBC: 5.57 10*6/uL (ref 4.20–5.80)
RDW: 13.6 % (ref 11.0–15.0)
Total Lymphocyte: 28.2 %
WBC: 7.2 10*3/uL (ref 3.8–10.8)

## 2021-09-15 LAB — T4, FREE: Free T4: 1.1 ng/dL (ref 0.8–1.8)

## 2021-09-15 LAB — TSH: TSH: 0.9 mIU/L (ref 0.40–4.50)

## 2021-09-15 LAB — LIPID PANEL
Cholesterol: 211 mg/dL — ABNORMAL HIGH (ref <200)
HDL: 62 mg/dL (ref >=40)
LDL Cholesterol (Calc): 132 mg/dL (calc) — ABNORMAL HIGH
Non-HDL Cholesterol (Calc): 149 mg/dL (calc) — ABNORMAL HIGH (ref <130)
Total CHOL/HDL Ratio: 3.4 (calc) (ref <5.0)
Triglycerides: 71 mg/dL (ref <150)

## 2021-09-15 LAB — PSA: PSA: 0.28 ng/mL (ref <=4.00)

## 2021-09-15 LAB — HEMOGLOBIN A1C
Hgb A1c MFr Bld: 5.6 % of total Hgb (ref <5.7)
Mean Plasma Glucose: 114 mg/dL
eAG (mmol/L): 6.3 mmol/L

## 2021-09-17 ENCOUNTER — Other Ambulatory Visit: Payer: Self-pay | Admitting: Family Medicine

## 2021-09-17 DIAGNOSIS — E782 Mixed hyperlipidemia: Secondary | ICD-10-CM

## 2021-09-17 MED ORDER — PRAVASTATIN SODIUM 80 MG PO TABS: 80.0000 mg | ORAL_TABLET | Freq: Every day | ORAL | 3 refills | Status: DC

## 2021-10-12 ENCOUNTER — Ambulatory Visit (INDEPENDENT_AMBULATORY_CARE_PROVIDER_SITE_OTHER): Payer: Managed Care, Other (non HMO) | Admitting: Family Medicine

## 2021-10-12 VITALS — BP 126/80 | HR 82 | Temp 98.5°F | Wt 214.6 lb

## 2021-10-12 DIAGNOSIS — M10472 Other secondary gout, left ankle and foot: Secondary | ICD-10-CM | POA: Diagnosis not present

## 2021-10-12 DIAGNOSIS — M79675 Pain in left toe(s): Secondary | ICD-10-CM

## 2021-10-12 MED ORDER — PREDNISONE 10 MG PO TABS: ORAL_TABLET | ORAL | 0 refills | Status: DC

## 2021-10-12 NOTE — Progress Notes (Signed)
Subjective:    Patient ID: Roger Fowler, male    DOB: 1965-10-02, 56 y.o.   MRN: [id number]  Chief Complaint  Patient presents with   Foot Pain    It is better today than the past 2 days. Felt swollen, but was not, was painful. Felt like pins and needles. Took ibuprofen the last 2-3 days. Cut back on a few food.     HPI Patient was seen today for acute concern.  Pt with L great toe pain and edema on Tuesday (4 days ago).  Felt like he was hit with a hammer with slight redness to the toe.  Pt notes being off from work for the last 4 days.  Had more EtOH, processed meats/burgers while off.  Denies injury, pain if bed sheet touches foot, hearing any pops, clicks or feeling any tears.  Took ibuprofen for symptoms.  Past Medical History:  Diagnosis Date   Arthritis    Eczema    Hyperlipidemia    Psoriasis    Tobacco use     Allergies  Allergen Reactions   Latex     ROS General: Denies fever, chills, night sweats, changes in weight, changes in appetite HEENT: Denies headaches, ear pain, changes in vision, rhinorrhea, sore throat CV: Denies CP, palpitations, SOB, orthopnea Pulm: Denies SOB, cough, wheezing GI: Denies abdominal pain, nausea, vomiting, diarrhea, constipation GU: Denies dysuria, hematuria, frequency, vaginal discharge Msk: Denies muscle cramps, joint pains  +L foot pain, edema, and erythema Neuro: Denies weakness, numbness, tingling Skin: Denies rashes, bruising Psych: Denies depression, anxiety, hallucinations     Objective:    Blood pressure 126/80, pulse 82, temperature 98.5 F (36.9 C), temperature source Oral, weight 214 lb 9.6 oz (97.3 kg), SpO2 95 %.  Gen. Pleasant, well-nourished, in no distress, normal affect   HEENT: [place]/AT, face symmetric, conjunctiva clear, no scleral icterus, PERRLA, EOMI, nares patent without drainage Lungs: no accessory muscle use, CTAB, no wheezes or rales Cardiovascular: RRR, no m/r/g, no peripheral edema Musculoskeletal: No  deformities, no cyanosis or clubbing, normal tone Neuro:  A&Ox3, CN II-XII intact, normal gait Skin:  Warm, no lesions/ rash.  L great toe with mild edema and discomfort with flexion and extension.  No TTP of midfoot, MTP jts, erythema or increased warmth.   Wt Readings from Last 3 Encounters:  09/14/21 215 lb 12.8 oz (97.9 kg)  08/23/19 202 lb (91.6 kg)  06/11/18 206 lb (93.4 kg)    Lab Results  Component Value Date   WBC 7.2 09/14/2021   HGB 15.7 09/14/2021   HCT 46.5 09/14/2021   PLT 200 09/14/2021   GLUCOSE 85 09/14/2021   CHOL 211 (H) 09/14/2021   TRIG 71 09/14/2021   HDL 62 09/14/2021   LDLDIRECT 187.9 01/09/2010   LDLCALC 132 (H) 09/14/2021   ALT 41 09/14/2021   AST 36 (H) 09/14/2021   NA 140 09/14/2021   K 4.5 09/14/2021   CL 107 09/14/2021   CREATININE 1.02 09/14/2021   BUN 10 09/14/2021   CO2 24 09/14/2021   TSH 0.90 09/14/2021   PSA 0.28 09/14/2021   HGBA1C 5.6 09/14/2021    Assessment/Plan:  Pain of left great toe -new problem -improving  Acute gout due to other secondary cause involving toe of left foot  -suspected gout flare - Plan: predniSONE (DELTASONE) 10 MG tablet  Improving left toe pain.  Pain in L foot likely 2/2 gout vs pseudogout given recent increased intake of EtOH and processed meat while on  vacation. Also consider arthritis.  Will hold off on labs as Uric acid level may not correlate with current symptoms.  No jt effusion present for aspiration.  Increase po intake of water.  Continue Ibuprofen/NSAIDs.  For continued pain start prednisone taper.  Given handout.  F/u prn  Abbe Amsterdam, MD

## 2021-10-12 NOTE — Patient Instructions (Signed)
You can also try tart cherry juice to help with symptoms.

## 2021-10-28 ENCOUNTER — Encounter: Payer: Self-pay | Admitting: Family Medicine

## 2021-11-26 ENCOUNTER — Encounter: Payer: Self-pay | Admitting: Family Medicine

## 2021-11-29 ENCOUNTER — Other Ambulatory Visit: Payer: Self-pay | Admitting: Family Medicine

## 2021-11-29 DIAGNOSIS — F419 Anxiety disorder, unspecified: Secondary | ICD-10-CM

## 2021-12-21 ENCOUNTER — Ambulatory Visit: Payer: Managed Care, Other (non HMO) | Admitting: Family Medicine

## 2022-02-07 IMAGING — DX DG CHEST 2V
2 series · 2 of 2 positions shown · non-contrast
Comparison: Rib films 04/30/2016

CLINICAL DATA: Short of breath and chest tightness. History of
anxiety and tobacco abuse. Recent DALI1-1N vaccine.

EXAM:
CHEST - 2 VIEW

[chest pa]
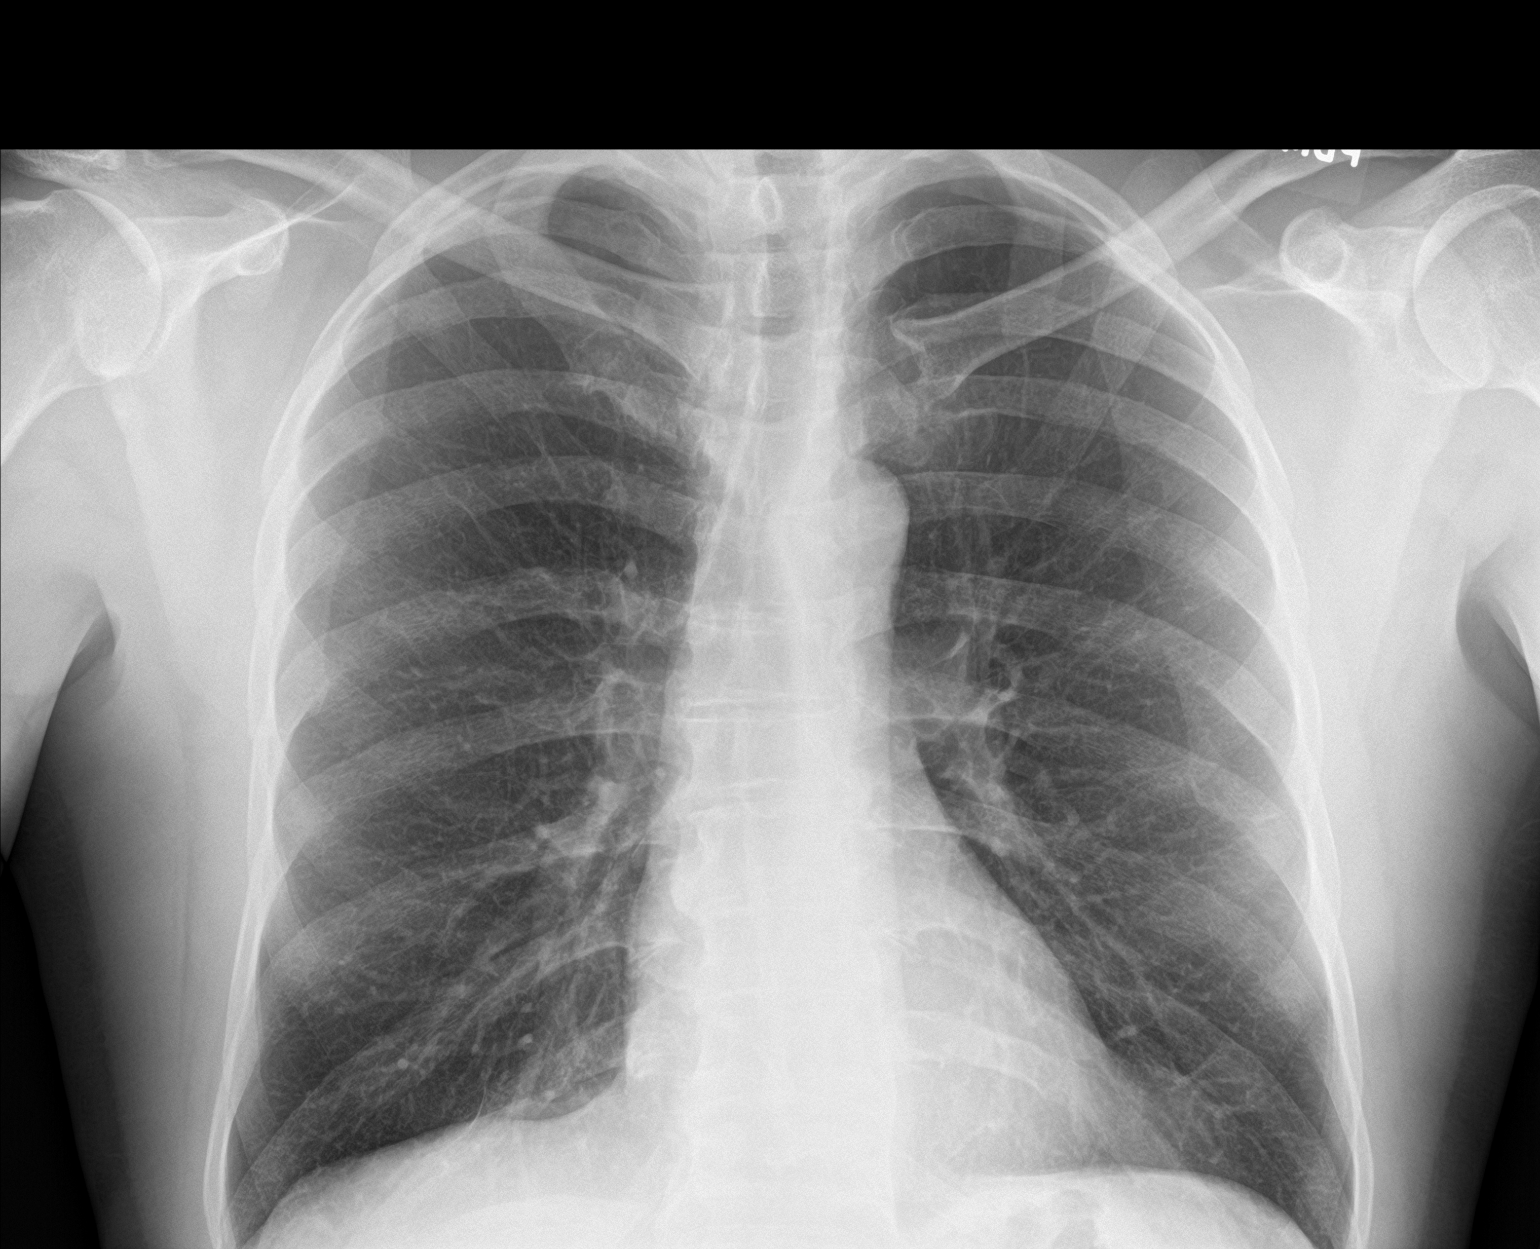

[chest lat]
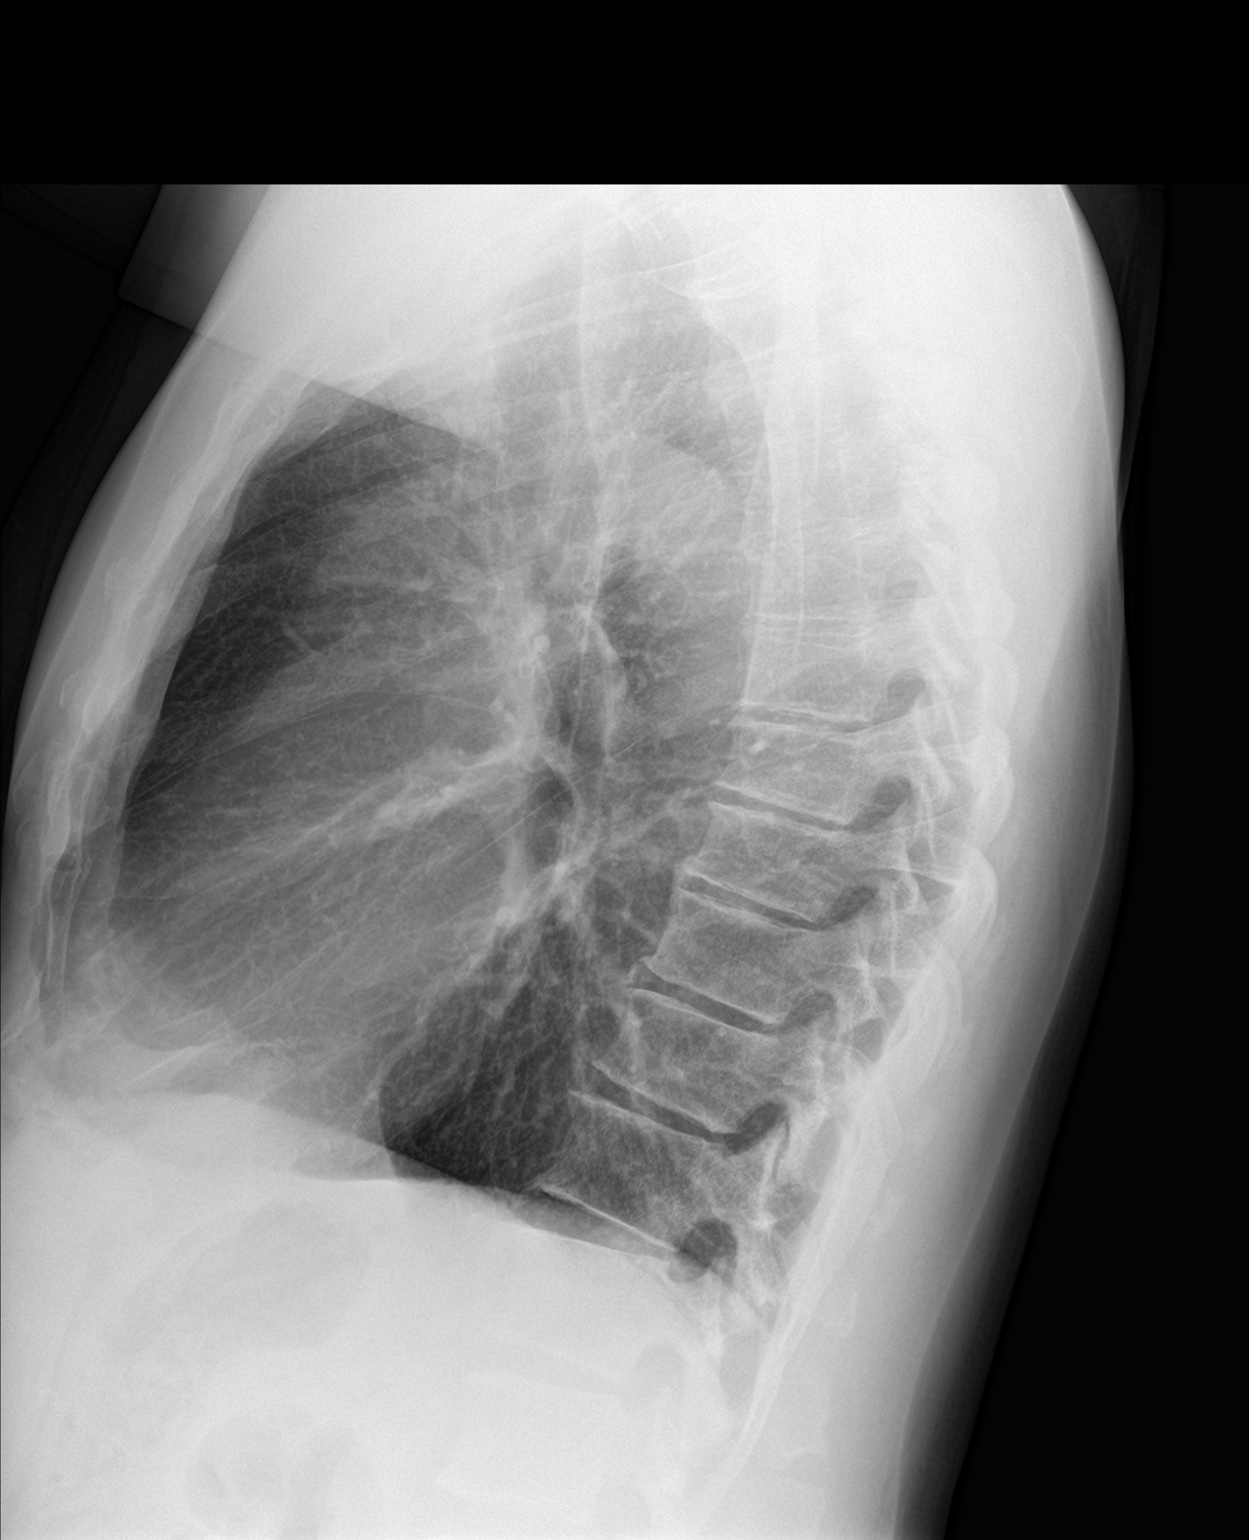

[2 of 2 positions shown; findings below may reference images not displayed]

FINDINGS: Mild hyperinflation. Midline trachea. Patient minimally rotated
left. Normal heart size and mediastinal contours. Costophrenic
angles are excluded from the frontal radiograph. No pleural effusion
or pneumothorax. Clear lungs.
IMPRESSION: Hyperinflation, without acute disease.

## 2022-03-04 ENCOUNTER — Telehealth: Payer: Self-pay | Admitting: Family Medicine

## 2022-03-04 NOTE — Telephone Encounter (Signed)
Pt will be here on 03/08/22 to discuss medication refills. Pt wants to make sure that he will have labs done on that day, as he will be fasting.  Please advise.

## 2022-03-05 NOTE — Telephone Encounter (Signed)
Pt does not want to come in early he will fast at least 8 hrs prior

## 2022-03-05 NOTE — Telephone Encounter (Signed)
Pt was called to get him to come in earlier in the day,  due to fasting . No answer. Left VM for Pt to call back asap.

## 2022-03-08 ENCOUNTER — Other Ambulatory Visit: Payer: Managed Care, Other (non HMO)

## 2022-03-08 ENCOUNTER — Ambulatory Visit: Payer: Managed Care, Other (non HMO) | Admitting: Family Medicine

## 2022-03-08 ENCOUNTER — Encounter: Payer: Self-pay | Admitting: Family Medicine

## 2022-03-08 VITALS — BP 138/86 | HR 86 | Temp 98.8°F | Wt 215.0 lb

## 2022-03-08 DIAGNOSIS — F419 Anxiety disorder, unspecified: Secondary | ICD-10-CM

## 2022-03-08 DIAGNOSIS — R7401 Elevation of levels of liver transaminase levels: Secondary | ICD-10-CM | POA: Diagnosis not present

## 2022-03-08 DIAGNOSIS — E782 Mixed hyperlipidemia: Secondary | ICD-10-CM | POA: Diagnosis not present

## 2022-03-08 LAB — COMPREHENSIVE METABOLIC PANEL
ALT: 32 U/L (ref 0–53)
AST: 32 U/L (ref 0–37)
Albumin: 4.4 g/dL (ref 3.5–5.2)
Alkaline Phosphatase: 82 U/L (ref 39–117)
BUN: 10 mg/dL (ref 6–23)
CO2: 26 mEq/L (ref 19–32)
Calcium: 9.7 mg/dL (ref 8.4–10.5)
Chloride: 105 mEq/L (ref 96–112)
Creatinine, Ser: 1 mg/dL (ref 0.40–1.50)
GFR: 84.73 mL/min (ref >60.00)
Glucose, Bld: 90 mg/dL (ref 70–99)
Potassium: 4.4 mEq/L (ref 3.5–5.1)
Sodium: 137 mEq/L (ref 135–145)
Total Bilirubin: 0.5 mg/dL (ref 0.2–1.2)
Total Protein: 7.6 g/dL (ref 6.0–8.3)

## 2022-03-08 LAB — LIPID PANEL
Cholesterol: 188 mg/dL (ref 0–200)
HDL: 57.8 mg/dL (ref >39.00)
LDL Cholesterol: 119 mg/dL — ABNORMAL HIGH (ref 0–99)
NonHDL: 130.31
Total CHOL/HDL Ratio: 3
Triglycerides: 56 mg/dL (ref 0.0–149.0)
VLDL: 11.2 mg/dL (ref 0.0–40.0)

## 2022-03-08 LAB — GAMMA GT: GGT: 41 U/L (ref 7–51)

## 2022-03-08 MED ORDER — ALPRAZOLAM 0.5 MG PO TABS: 0.5000 mg | ORAL_TABLET | Freq: Two times a day (BID) | ORAL | 1 refills | Status: DC | PRN

## 2022-03-08 NOTE — Progress Notes (Signed)
Subjective:    Patient ID: Roger Fowler, male    DOB: 04-24-1966, 56 y.o.   MRN: [id number]  Chief Complaint  Patient presents with   Follow-up    Medication -pravastatin    HPI Patient was seen today for f/u.  Pt here for repeat lipid panel and pravastin refill.  Pt working on diet changes.  Pt notes increased stress since last OFV.  Shortly after the visit his mother's cancer returned and she died a few months later.  Pt now caring for his adult sister with autism.  Patient requesting a refill on Xanax.  Past Medical History:  Diagnosis Date   Arthritis    Eczema    Hyperlipidemia    Psoriasis    Tobacco use     Allergies  Allergen Reactions   Latex     ROS General: Denies fever, chills, night sweats, changes in weight, changes in appetite HEENT: Denies headaches, ear pain, changes in vision, rhinorrhea, sore throat CV: Denies CP, palpitations, SOB, orthopnea Pulm: Denies SOB, cough, wheezing GI: Denies abdominal pain, nausea, vomiting, diarrhea, constipation GU: Denies dysuria, hematuria, frequency Msk: Denies muscle cramps, joint pains Neuro: Denies weakness, numbness, tingling Skin: Denies rashes, bruising Psych: Denies depression, hallucinations  +anxiety    Objective:    Blood pressure 138/86, pulse 86, temperature 98.8 F (37.1 C), temperature source Oral, weight 215 lb (97.5 kg), SpO2 98 %.  Gen. Pleasant, well-nourished, in no distress, normal affect   HEENT: [place]/AT, face symmetric, conjunctiva clear, no scleral icterus, PERRLA, EOMI, nares patent without drainage Lungs: no accessory muscle use, CTAB, no wheezes or rales Cardiovascular: RRR, no m/r/g, no peripheral edema Neuro:  A&Ox3, CN II-XII intact, normal gait   Wt Readings from Last 3 Encounters:  03/08/22 215 lb (97.5 kg)  10/12/21 214 lb 9.6 oz (97.3 kg)  09/14/21 215 lb 12.8 oz (97.9 kg)    Lab Results  Component Value Date   WBC 7.2 09/14/2021   HGB 15.7 09/14/2021   HCT 46.5 09/14/2021    PLT 200 09/14/2021   GLUCOSE 85 09/14/2021   CHOL 211 (H) 09/14/2021   TRIG 71 09/14/2021   HDL 62 09/14/2021   LDLDIRECT 187.9 01/09/2010   LDLCALC 132 (H) 09/14/2021   ALT 41 09/14/2021   AST 36 (H) 09/14/2021   NA 140 09/14/2021   K 4.5 09/14/2021   CL 107 09/14/2021   CREATININE 1.02 09/14/2021   BUN 10 09/14/2021   CO2 24 09/14/2021   TSH 0.90 09/14/2021   PSA 0.28 09/14/2021   HGBA1C 5.6 09/14/2021      03/08/2022    2:42 PM 10/12/2021    4:14 PM 09/14/2021    3:21 PM  Depression screen PHQ 2/9  Decreased Interest 1 0 0  Down, Depressed, Hopeless 0 0 0  PHQ - 2 Score 1 0 0  Altered sleeping 2 1 1   Tired, decreased energy 1 0 1  Change in appetite 1 1 1   Feeling bad or failure about yourself  0 0 0  Trouble concentrating 1 0 0  Moving slowly or fidgety/restless 0 0 0  Suicidal thoughts 0 0 0  PHQ-9 Score 6 2 3   Difficult doing work/chores Not difficult at all Not difficult at all Not difficult at all      09/14/2021    4:16 PM 01/13/2020    2:44 PM 07/17/2017    2:45 PM  GAD 7 : Generalized Anxiety Score  Nervous, Anxious, on Edge 1 2  1  Control/stop worrying 1 2 2   Worry too much - different things 3 2 2   Trouble relaxing 2 3 1   Restless 1 3 2   Easily annoyed or irritable 1 2 2   Afraid - awful might happen 0 2 0  Total GAD 7 Score 9 16 10   Anxiety Difficulty Not difficult at all       Assessment/Plan:  Mixed hyperlipidemia -Total cholesterol 211, LDL 132/5/23 -Continue pravastatin 80 mg daily and lifestyle modifications -Further recommendations based on lab results.  - Plan: Lipid panel, CMP  Elevated AST (SGOT)  -Recheck AST.  Was 37 on 09/14/2021. - Plan: CMP, Gamma GT  Anxiety  -Increased 2/2 recent stress related to family and the death of pt's mother -Continue self-care and exercises such as deep breathing -Consider counseling/support groups -PMDP reviewed - Plan: ALPRAZolam (XANAX) 0.5 MG tablet  F/u as needed in 3-4 months, sooner if  needed  Grier Mitts, MD

## 2022-03-12 ENCOUNTER — Ambulatory Visit: Payer: Managed Care, Other (non HMO) | Admitting: Podiatry

## 2022-03-12 ENCOUNTER — Ambulatory Visit (INDEPENDENT_AMBULATORY_CARE_PROVIDER_SITE_OTHER): Payer: Managed Care, Other (non HMO)

## 2022-03-12 DIAGNOSIS — L84 Corns and callosities: Secondary | ICD-10-CM

## 2022-03-12 DIAGNOSIS — M79672 Pain in left foot: Secondary | ICD-10-CM | POA: Diagnosis not present

## 2022-03-12 DIAGNOSIS — M205X2 Other deformities of toe(s) (acquired), left foot: Secondary | ICD-10-CM

## 2022-03-12 DIAGNOSIS — B49 Unspecified mycosis: Secondary | ICD-10-CM | POA: Diagnosis not present

## 2022-03-12 MED ORDER — NAFTIFINE HCL 2 % EX GEL: 1.0000 | Freq: Every day | CUTANEOUS | 1 refills | Status: AC

## 2022-03-12 NOTE — Progress Notes (Signed)
Subjective:   Patient ID: Roger Fowler, male   DOB: 56 y.o.   MRN: [id number]   HPI Chief Complaint  Patient presents with   Callouses    Patient came in today for a fungus in between the 4th and 5th toe interspace, rate of pain 8 out of 10 when wearing a shoe, patient also has a left foot callus, which also is cause some pain,    56 year old male presents with above concerns.  This has been ongoing for about 10 years. No treatment for the area between the 4th and 5ht toe. In his 20's he had a pience of glass in his foot and 6 months later and to go back in to get it out. Since then he has had a callus over the rea. This is on the sub 5 area.    ROS  Past Medical History:  Diagnosis Date   Arthritis    Eczema    Hyperlipidemia    Psoriasis    Tobacco use     No past surgical history on file.   Current Outpatient Medications:    ALPRAZolam (XANAX) 0.5 MG tablet, Take 1 tablet (0.5 mg total) by mouth 2 (two) times daily as needed for anxiety., Disp: 60 tablet, Rfl: 1   fluticasone (FLONASE) 50 MCG/ACT nasal spray, Place 2 sprays into both nostrils daily., Disp: 16 g, Rfl: 6   ipratropium (ATROVENT) 0.03 % nasal spray, Place 2 sprays into both nostrils 3 (three) times daily. (Patient not taking: Reported on 09/14/2021), Disp: , Rfl:    pravastatin (PRAVACHOL) 80 MG tablet, Take 1 tablet (80 mg total) by mouth daily., Disp: 90 tablet, Rfl: 3  Allergies  Allergen Reactions   Latex          Objective:  Physical Exam  General: AAO x3, NAD  Dermatological: On the fourth interspace left foot is thick hyperkeratotic tissue with some macerated tissue present on the sulcus.  There is no underlying ulceration drainage or any signs of infection noted.  Hyperkeratotic lesion noted plantar metatarsal head left foot without any underlying ulceration drainage or signs of infection.  No evidence of foreign body.  Vascular: Dorsalis Pedis artery and Posterior Tibial artery pedal pulses are  2/4 bilateral with immedate capillary fill time. There is no pain with calf compression, swelling, warmth, erythema.   Neruologic: Grossly intact via light touch bilateral.  Musculoskeletal: Tenderness to hyperkeratotic lesions but no other areas of discomfort.  Adductovarus of fifth toes.  Gait: Unassisted, Nonantalgic.       Assessment:   56 year old male with likely tinea pedis, hyperkeratotic lesions     Plan:  -Treatment options discussed including all alternatives, risks, and complications -Etiology of symptoms were discussed -X-rays obtained reviewed.  3 views left foot were obtained.  Adductovarus present fifth toe.  No evidence of acute fracture, osteomyelitis. -Has a postoperative bleeding that hyperkeratotic tissue submetatarsal area of the left foot as well as the fourth interspace left foot.  Along the fourth interspace I did send this for webspace pain all through [employer] [facility].  Specimen was given to Isidore Moos, [place].  For now prescribed Naftin gel.  Offloading.   -Debrided the callus left foot and complications or bleeding.  Continue moisturizer, offloading.  Return in about 4 weeks (around 04/09/2022).  Trula Slade DPM

## 2022-04-01 ENCOUNTER — Other Ambulatory Visit: Payer: Self-pay | Admitting: Podiatry

## 2022-04-01 ENCOUNTER — Telehealth: Payer: Self-pay | Admitting: *Deleted

## 2022-04-01 MED ORDER — DOXYCYCLINE HYCLATE 100 MG PO TABS: 100.0000 mg | ORAL_TABLET | Freq: Two times a day (BID) | ORAL | 0 refills | Status: DC

## 2022-04-01 MED ORDER — GENTAMICIN SULFATE 0.1 % EX OINT: 1.0000 | TOPICAL_OINTMENT | Freq: Every day | CUTANEOUS | 0 refills | Status: AC

## 2022-04-01 NOTE — Telephone Encounter (Signed)
Neda with [facility], sending a critical pathology  results for patient,have sent twice, wanted to make doctor aware.

## 2022-04-01 NOTE — Telephone Encounter (Signed)
Results have been placed in providers folder for review

## 2022-04-02 ENCOUNTER — Telehealth: Payer: Self-pay

## 2022-04-03 NOTE — Telephone Encounter (Signed)
Form is in your folder Dr. Ardelle Anton

## 2022-04-03 NOTE — Telephone Encounter (Signed)
Noted, thanks!

## 2022-04-17 ENCOUNTER — Encounter: Payer: Self-pay | Admitting: Family Medicine

## 2022-04-17 ENCOUNTER — Ambulatory Visit: Payer: Managed Care, Other (non HMO) | Admitting: Family Medicine

## 2022-04-17 VITALS — BP 122/80 | HR 86 | Temp 98.0°F | Wt 219.0 lb

## 2022-04-17 DIAGNOSIS — J069 Acute upper respiratory infection, unspecified: Secondary | ICD-10-CM

## 2022-04-17 DIAGNOSIS — M791 Myalgia, unspecified site: Secondary | ICD-10-CM

## 2022-04-17 DIAGNOSIS — R0981 Nasal congestion: Secondary | ICD-10-CM | POA: Diagnosis not present

## 2022-04-17 LAB — POCT INFLUENZA A/B
Influenza A, POC: NEGATIVE
Influenza B, POC: NEGATIVE

## 2022-04-17 LAB — POC COVID19 BINAXNOW: SARS Coronavirus 2 Ag: NEGATIVE

## 2022-04-17 NOTE — Progress Notes (Signed)
Subjective:    Patient ID: Roger Fowler, male    DOB: [birth year], 56 y.o.   MRN: [id number]  Chief Complaint  Patient presents with   Wheezing   OTHER    Pt reports sx of bodyaches, chest congestion and nasal congestion, slight fever, cough last last Thursday. Today pt reports just having nasal and chest congestion.     HPI Patient was seen today for concerns.  Pt states he started feeling sick late last Thursday night.  Endorses flulike symptoms on that Friday of congestion, cough, body aches, fatigue, head pressure, headache.  2 days later patient started feeling better.  Patient denies ear pain/pressure, sore throat.  Tried over-the-counter NyQuil, ibuprofen, orange juice, steam from shower, nasal sprays.  Past Medical History:  Diagnosis Date   Arthritis    Eczema    Hyperlipidemia    Psoriasis    Tobacco use     Allergies  Allergen Reactions   Latex     ROS General: Denies fever, chills, night sweats, changes in weight, changes in appetite HEENT: Denies headaches, ear pain, changes in vision, rhinorrhea, sore throat +congestion CV: Denies CP, palpitations, SOB, orthopnea Pulm: Denies SOB, cough, wheezing +cough GI: Denies abdominal pain, nausea, vomiting, diarrhea, constipation GU: Denies dysuria, hematuria, frequency Msk: Denies muscle cramps, joint pains Neuro: Denies weakness, numbness, tingling Skin: Denies rashes, bruising Psych: Denies depression, anxiety, hallucinations    Objective:    Blood pressure 122/80, pulse 86, temperature 98 F (36.7 C), temperature source Oral, weight 219 lb (99.3 kg), SpO2 97 %.  Gen. Pleasant, well-nourished, in no distress, normal affect   HEENT: National/AT, face symmetric, conjunctiva clear, no scleral icterus, PERRLA, EOMI, nares patent without drainage, pharynx with mild erythema, no exudate.  TMs full bilaterally. Lungs: no accessory muscle use, CTAB, no wheezes or rales Cardiovascular: RRR, no m/r/g, no peripheral  edema Musculoskeletal: No deformities, no cyanosis or clubbing, normal tone Neuro:  A&Ox3, CN II-XII intact, normal gait Skin:  Warm, no lesions/ rash   Wt Readings from Last 3 Encounters:  04/17/22 219 lb (99.3 kg)  03/08/22 215 lb (97.5 kg)  10/12/21 214 lb 9.6 oz (97.3 kg)    Lab Results  Component Value Date   WBC 7.2 09/14/2021   HGB 15.7 09/14/2021   HCT 46.5 09/14/2021   PLT 200 09/14/2021   GLUCOSE 90 03/08/2022   CHOL 188 03/08/2022   TRIG 56.0 03/08/2022   HDL 57.80 03/08/2022   LDLDIRECT 187.9 01/09/2010   LDLCALC 119 (H) 03/08/2022   ALT 32 03/08/2022   AST 32 03/08/2022   NA 137 03/08/2022   K 4.4 03/08/2022   CL 105 03/08/2022   CREATININE 1.00 03/08/2022   BUN 10 03/08/2022   CO2 26 03/08/2022   TSH 0.90 09/14/2021   PSA 0.28 09/14/2021   HGBA1C 5.6 09/14/2021    Assessment/Plan:  Viral URI with cough  Nasal congestion - Plan: POC COVID-19, POC Influenza A/B  Muscle pain - Plan: POC Influenza A/B  Symptoms likely 2/2 viral illness, improving.  COVID, flu testing negative in clinic.  Continue management with over-the-counter medications.  Continue nasal sprays as needed.  Given precautions  F/u prn  Abbe Amsterdam, MD

## 2022-04-17 NOTE — Patient Instructions (Signed)
COVID and flu testing negative this visit.  Continue supportive care with over-the-counter cough/cold medications, hydration, Tylenol or NSAIDs as needed.

## 2022-09-11 ENCOUNTER — Telehealth: Payer: Self-pay | Admitting: Family Medicine

## 2022-09-11 NOTE — Telephone Encounter (Signed)
Pt called to inform MD that he has received 3 different bills for the last 2 OV.  DOS:  03/08/22 = $98.00 & $175.00 (Pt also had labs on this day)  DOS:  04/17/22 = $145.74  Pt has [employer].  Pt states that after calling every number he could get his hands on, he was finally told MD is no longer enrolled in [employer] and no longer accepts [employer], and that may be the reason for the charges.  Pt is asking if that statement is true?  Also, Pt is asking if MD could please look into this and verify if these charges are legit?   Pt added that he would try to fax or drop off copies of the bills for MD's review, as soon as he can.

## 2022-09-16 NOTE — Telephone Encounter (Signed)
Pt is aware the situation is process per candice

## 2022-09-24 ENCOUNTER — Other Ambulatory Visit: Payer: Self-pay | Admitting: Family Medicine

## 2022-09-24 DIAGNOSIS — F419 Anxiety disorder, unspecified: Secondary | ICD-10-CM

## 2022-10-11 NOTE — Telephone Encounter (Signed)
Pt is aware of the info that was given to me through [place] and our Billing fee specialist. Pt will be calling [facility] billing concerning payment plan for the bills he received and will be calling [employer] concerning the $1k deductible that is still remaining.   FYI.

## 2023-01-10 ENCOUNTER — Encounter: Payer: Self-pay | Admitting: Family Medicine

## 2023-01-10 ENCOUNTER — Ambulatory Visit: Payer: Managed Care, Other (non HMO) | Admitting: Family Medicine

## 2023-01-10 ENCOUNTER — Encounter: Payer: Managed Care, Other (non HMO) | Admitting: Family Medicine

## 2023-01-10 VITALS — BP 142/72 | HR 85 | Temp 98.7°F | Ht 71.61 in | Wt 212.4 lb

## 2023-01-10 DIAGNOSIS — F419 Anxiety disorder, unspecified: Secondary | ICD-10-CM

## 2023-01-10 DIAGNOSIS — Z0000 Encounter for general adult medical examination without abnormal findings: Secondary | ICD-10-CM | POA: Diagnosis not present

## 2023-01-10 DIAGNOSIS — F17210 Nicotine dependence, cigarettes, uncomplicated: Secondary | ICD-10-CM | POA: Diagnosis not present

## 2023-01-10 DIAGNOSIS — Z125 Encounter for screening for malignant neoplasm of prostate: Secondary | ICD-10-CM

## 2023-01-10 DIAGNOSIS — E782 Mixed hyperlipidemia: Secondary | ICD-10-CM | POA: Diagnosis not present

## 2023-01-10 DIAGNOSIS — Z1211 Encounter for screening for malignant neoplasm of colon: Secondary | ICD-10-CM

## 2023-01-10 MED ORDER — ALPRAZOLAM 0.5 MG PO TABS
0.5000 mg | ORAL_TABLET | Freq: Two times a day (BID) | ORAL | 1 refills | Status: DC | PRN
Start: 2023-01-10 — End: 2023-07-14

## 2023-01-10 MED ORDER — PRAVASTATIN SODIUM 80 MG PO TABS
80.0000 mg | ORAL_TABLET | Freq: Every day | ORAL | 3 refills | Status: DC
Start: 2023-01-10 — End: 2024-03-02

## 2023-01-10 NOTE — Progress Notes (Signed)
Established Patient Office Visit   Subjective  Patient ID: Roger Fowler, male    DOB: Apr 27, 1966  Age: 57 y.o. MRN: [id number]  Chief Complaint  Patient presents with   Annual Exam    Patient is a 58 year old male seen for CPE and follow-up on chronic conditions.  Patient states he been doing well overall.  Still smoking less than a pack per day.  Has cut down.  Anxiety improving.  Taking Xanax as needed.  Was able to finally straighten out from insurance billing issues.  Interested in having colonoscopy scheduled.    Past Medical History:  Diagnosis Date   Arthritis    Eczema    Hyperlipidemia    Psoriasis    Tobacco use    History reviewed. No pertinent surgical history. Social History   Tobacco Use   Smoking status: Every Day    Current packs/day: 1.00    Average packs/day: 1 pack/day for 27.0 years (27.0 ttl pk-yrs)    Types: Cigarettes   Smokeless tobacco: Current  Substance Use Topics   Alcohol use: Yes   Drug use: No   Family History  Problem Relation Age of Onset   Hypertension Mother    Cancer Mother    Hypertension Brother    Diabetes Mellitus II Neg Hx    Colon cancer Neg Hx    Prostate cancer Neg Hx    Allergies  Allergen Reactions   Latex       ROS Negative unless stated above    Objective:     BP (!) 142/72 (BP Location: Right Arm, Patient Position: Sitting, Cuff Size: Normal)   Pulse 85   Temp 98.7 F (37.1 C) (Oral)   Ht 5' 11.61" (1.819 m)   Wt 212 lb 6.4 oz (96.3 kg)   SpO2 98%   BMI 29.12 kg/m  BP Readings from Last 3 Encounters:  01/10/23 (!) 142/72  04/17/22 122/80  03/08/22 138/86   Wt Readings from Last 3 Encounters:  01/10/23 212 lb 6.4 oz (96.3 kg)  04/17/22 219 lb (99.3 kg)  03/08/22 215 lb (97.5 kg)      Physical Exam Constitutional:      Appearance: Normal appearance.  HENT:     Head: Normocephalic and atraumatic.     Right Ear: Tympanic membrane, ear canal and external ear normal.     Left Ear:  Tympanic membrane, ear canal and external ear normal.     Nose: Nose normal.     Mouth/Throat:     Mouth: Mucous membranes are moist.     Pharynx: No oropharyngeal exudate or posterior oropharyngeal erythema.  Eyes:     General: No scleral icterus.    Extraocular Movements: Extraocular movements intact.     Conjunctiva/sclera: Conjunctivae normal.     Pupils: Pupils are equal, round, and reactive to light.  Neck:     Thyroid: No thyromegaly.  Cardiovascular:     Rate and Rhythm: Normal rate and regular rhythm.     Pulses: Normal pulses.     Heart sounds: Normal heart sounds. No murmur heard.    No friction rub.  Pulmonary:     Effort: Pulmonary effort is normal.     Breath sounds: Normal breath sounds. No wheezing, rhonchi or rales.  Abdominal:     General: Bowel sounds are normal.     Palpations: Abdomen is soft.     Tenderness: There is no abdominal tenderness.  Musculoskeletal:        General:  No deformity. Normal range of motion.  Lymphadenopathy:     Cervical: No cervical adenopathy.  Skin:    General: Skin is warm and dry.     Findings: No lesion.  Neurological:     General: No focal deficit present.     Mental Status: He is alert and oriented to person, place, and time.  Psychiatric:        Mood and Affect: Mood normal.        Thought Content: Thought content normal.       01/10/2023    2:49 PM 03/08/2022    2:42 PM 10/12/2021    4:14 PM  Depression screen PHQ 2/9  Decreased Interest 0 1 0  Down, Depressed, Hopeless 0 0 0  PHQ - 2 Score 0 1 0  Altered sleeping 1 2 1   Tired, decreased energy 1 1 0  Change in appetite 1 1 1   Feeling bad or failure about yourself  0 0 0  Trouble concentrating 0 1 0  Moving slowly or fidgety/restless 1 0 0  Suicidal thoughts 0 0 0  PHQ-9 Score 4 6 2   Difficult doing work/chores Not difficult at all Not difficult at all Not difficult at all      01/10/2023    2:50 PM 09/14/2021    4:16 PM 01/13/2020    2:44 PM 07/17/2017    2:45  PM  GAD 7 : Generalized Anxiety Score  Nervous, Anxious, on Edge 2 1 2 1   Control/stop worrying 1 1 2 2   Worry too much - different things 1 3 2 2   Trouble relaxing 1 2 3 1   Restless 2 1 3 2   Easily annoyed or irritable 2 1 2 2   Afraid - awful might happen 0 0 2 0  Total GAD 7 Score 9 9 16 10   Anxiety Difficulty Not difficult at all Not difficult at all        No results found for any visits on 01/10/23.    Assessment & Plan:  Well adult exam -Age-appropriate health screenings discussed. -Obtain labs -Referral placed for colonoscopy -Immunizations reviewed.  Patient declines shingles and flu vaccine this visit. -Next CPE in 1 year -     Hemoglobin A1c; Future  Screening for colon cancer -     Ambulatory referral to Gastroenterology  Anxiety -Stable -GAD-7 score 9 this visit -PHQ-9 score 4 -Continue self-care -Counseling -     TSH; Future -     ALPRAZolam; Take 1 tablet (0.5 mg total) by mouth 2 (two) times daily as needed for anxiety.  Dispense: 60 tablet; Refill: 1  Mixed hyperlipidemia -Total cholesterol 188, LDL 119, triglycerides 56, HDL 57 on 12/06/2021 -     Comprehensive metabolic panel; Future -     Lipid panel; Future -     Pravastatin Sodium; Take 1 tablet (80 mg total) by mouth daily.  Dispense: 90 tablet; Refill: 3  Cigarette nicotine dependence without complication -Smoking cessation counseling greater than 3 minutes, less than 10 minutes -Patient congratulated on cutting down. -Offered various quit aids. -     Comprehensive metabolic panel; Future -     CBC with Differential/Platelet; Future  Screening for prostate cancer -     PSA; Future   Return in about 6 months (around 07/11/2023), or Sooner if needed.   Deeann Saint, MD

## 2023-01-11 LAB — COMPREHENSIVE METABOLIC PANEL
AG Ratio: 1.4 (calc) (ref 1.0–2.5)
ALT: 39 U/L (ref 9–46)
AST: 39 U/L — ABNORMAL HIGH (ref 10–35)
Albumin: 4.4 g/dL (ref 3.6–5.1)
Alkaline phosphatase (APISO): 93 U/L (ref 35–144)
BUN: 10 mg/dL (ref 7–25)
CO2: 24 mmol/L (ref 20–32)
Calcium: 9.7 mg/dL (ref 8.6–10.3)
Chloride: 103 mmol/L (ref 98–110)
Creat: 0.96 mg/dL (ref 0.70–1.30)
Globulin: 3.1 g/dL (calc) (ref 1.9–3.7)
Glucose, Bld: 94 mg/dL (ref 65–99)
Potassium: 4.5 mmol/L (ref 3.5–5.3)
Sodium: 136 mmol/L (ref 135–146)
Total Bilirubin: 0.8 mg/dL (ref 0.2–1.2)
Total Protein: 7.5 g/dL (ref 6.1–8.1)

## 2023-01-11 LAB — CBC WITH DIFFERENTIAL/PLATELET
Absolute Monocytes: 608 {cells}/uL (ref 200–950)
Basophils Absolute: 30 {cells}/uL (ref 0–200)
Basophils Relative: 0.4 %
Eosinophils Absolute: 23 {cells}/uL (ref 15–500)
Eosinophils Relative: 0.3 %
HCT: 45.9 % (ref 38.5–50.0)
Hemoglobin: 15.2 g/dL (ref 13.2–17.1)
Lymphs Abs: 1928 {cells}/uL (ref 850–3900)
MCH: 27.5 pg (ref 27.0–33.0)
MCHC: 33.1 g/dL (ref 32.0–36.0)
MCV: 83 fL (ref 80.0–100.0)
MPV: 9.9 fL (ref 7.5–12.5)
Monocytes Relative: 8.1 %
Neutro Abs: 4913 {cells}/uL (ref 1500–7800)
Neutrophils Relative %: 65.5 %
Platelets: 217 10*3/uL (ref 140–400)
RBC: 5.53 10*6/uL (ref 4.20–5.80)
RDW: 14.2 % (ref 11.0–15.0)
Total Lymphocyte: 25.7 %
WBC: 7.5 10*3/uL (ref 3.8–10.8)

## 2023-01-11 LAB — HEMOGLOBIN A1C
Hgb A1c MFr Bld: 5.8 %{Hb} — ABNORMAL HIGH (ref <5.7)
Mean Plasma Glucose: 120 mg/dL
eAG (mmol/L): 6.6 mmol/L

## 2023-01-11 LAB — PSA: PSA: 0.39 ng/mL (ref <=4.00)

## 2023-01-11 LAB — LIPID PANEL
Cholesterol: 214 mg/dL — ABNORMAL HIGH (ref <200)
HDL: 68 mg/dL (ref >=40)
LDL Cholesterol (Calc): 129 mg/dL — ABNORMAL HIGH
Non-HDL Cholesterol (Calc): 146 mg/dL — ABNORMAL HIGH (ref <130)
Total CHOL/HDL Ratio: 3.1 (calc) (ref <5.0)
Triglycerides: 75 mg/dL (ref <150)

## 2023-01-11 LAB — TSH: TSH: 1.19 m[IU]/L (ref 0.40–4.50)

## 2023-01-23 ENCOUNTER — Encounter: Payer: Self-pay | Admitting: Family Medicine

## 2023-02-24 ENCOUNTER — Telehealth: Payer: Self-pay | Admitting: Family Medicine

## 2023-02-24 NOTE — Telephone Encounter (Signed)
Wants to know when pcp thinks he should return for a follow up

## 2023-02-24 NOTE — Telephone Encounter (Signed)
Spoke with patient, chart note says to follow-up in 6 mths

## 2023-03-12 ENCOUNTER — Encounter: Payer: Self-pay | Admitting: Family Medicine

## 2023-03-12 ENCOUNTER — Ambulatory Visit: Payer: Managed Care, Other (non HMO) | Admitting: Family Medicine

## 2023-03-12 ENCOUNTER — Telehealth: Payer: Self-pay | Admitting: Family Medicine

## 2023-03-12 VITALS — BP 136/72 | HR 94 | Temp 98.8°F | Ht 71.6 in | Wt 213.8 lb

## 2023-03-12 DIAGNOSIS — R361 Hematospermia: Secondary | ICD-10-CM

## 2023-03-12 LAB — POCT URINALYSIS DIPSTICK
Bilirubin, UA: NEGATIVE
Blood, UA: NEGATIVE
Glucose, UA: NEGATIVE
Ketones, UA: NEGATIVE
Leukocytes, UA: NEGATIVE
Nitrite, UA: NEGATIVE
Protein, UA: NEGATIVE
Spec Grav, UA: 1.01 (ref 1.010–1.025)
Urobilinogen, UA: 0.2 U/dL
pH, UA: 6.5 (ref 5.0–8.0)

## 2023-03-12 NOTE — Telephone Encounter (Signed)
Pt was just seen today by MD. Pt forgot to mention that he has been having cough and thick mucus. Pt is wondering if MD would be willing to send Abx &/or cough medication?  Please advise.

## 2023-03-12 NOTE — Progress Notes (Signed)
Established Patient Office Visit   Subjective  Patient ID: Roger Fowler, male    DOB: 1966-02-12  Age: 57 y.o. MRN: [id number]  Chief Complaint  Patient presents with   Sexual Problem    Patient had blood in semen 2 weeks ago, patient denies any pain, patient is asking for a referral to urology      Patient is a 57 year old male seen for acute concern.  Patient endorses noticing brownish discoloration/light pinkish blood in semen x 2.  First instance occurred 2 weeks ago after intercourse.  Patient and partner are monogamous.  They have noticed more of a heaviness in left testicle,denies edema or pain.  Patient denies urinary frequency, dysuria, difficulty starting or stopping stream, discharge, irritation, pain that moves, injury, fever, chills.  PSA normal at 0.39 on 01/10/2023.     Patient Active Problem List   Diagnosis Date Noted   Back pain 09/09/2014   Neck pain 09/09/2014   Preventative health care 12/10/2013   Change in bowel habits 12/10/2013   TOBACCO USE 01/09/2010   PSORIASIS 06/12/2007   Past Medical History:  Diagnosis Date   Arthritis    Eczema    Hyperlipidemia    Psoriasis    Tobacco use    History reviewed. No pertinent surgical history. Social History   Tobacco Use   Smoking status: Every Day    Current packs/day: 1.00    Average packs/day: 1 pack/day for 27.0 years (27.0 ttl pk-yrs)    Types: Cigarettes   Smokeless tobacco: Current  Substance Use Topics   Alcohol use: Yes   Drug use: No   Family History  Problem Relation Age of Onset   Hypertension Mother    Cancer Mother    Hypertension Brother    Diabetes Mellitus II Neg Hx    Colon cancer Neg Hx    Prostate cancer Neg Hx    Allergies  Allergen Reactions   Latex       ROS Negative unless stated above    Objective:     BP 136/72 (BP Location: Left Arm, Patient Position: Sitting, Cuff Size: Normal)   Pulse 94   Temp 98.8 F (37.1 C) (Oral)   Ht 5' 11.6" (1.819 m)   Wt  213 lb 12.8 oz (97 kg)   SpO2 98%   BMI 29.32 kg/m  BP Readings from Last 3 Encounters:  03/12/23 136/72  01/10/23 (!) 142/72  04/17/22 122/80   Wt Readings from Last 3 Encounters:  03/12/23 213 lb 12.8 oz (97 kg)  01/10/23 212 lb 6.4 oz (96.3 kg)  04/17/22 219 lb (99.3 kg)      Physical Exam Constitutional:      General: He is not in acute distress.    Appearance: Normal appearance.  HENT:     Head: Normocephalic and atraumatic.     Nose: Nose normal.     Mouth/Throat:     Mouth: Mucous membranes are moist.  Eyes:     Extraocular Movements: Extraocular movements intact.     Conjunctiva/sclera: Conjunctivae normal.  Cardiovascular:     Rate and Rhythm: Normal rate and regular rhythm.     Heart sounds: Normal heart sounds.  Pulmonary:     Effort: Pulmonary effort is normal.     Breath sounds: Normal breath sounds.  Skin:    General: Skin is warm and dry.  Neurological:     Mental Status: He is alert and oriented to person, place, and time.  Results for orders placed or performed in visit on 03/12/23  POCT urinalysis dipstick  Result Value Ref Range   Color, UA yellow    Clarity, UA clear    Glucose, UA Negative Negative   Bilirubin, UA neg    Ketones, UA neg    Spec Grav, UA 1.010 1.010 - 1.025   Blood, UA neg    pH, UA 6.5 5.0 - 8.0   Protein, UA Negative Negative   Urobilinogen, UA 0.2 0.2 or 1.0 E.U./dL   Nitrite, UA neg    Leukocytes, UA Negative Negative   Appearance     Odor        Assessment & Plan:  Hematospermia -     Urinalysis, Routine w reflex microscopic -     Urine Culture -     C. trachomatis/N. gonorrhoeae RNA -     POCT urinalysis dipstick -     Ambulatory referral to Urology  New problem.  Acute recurrences of blood in semen x 2 weeks.  Discussed possible causes including benign versus more problematic conditions.  POC UA negative.  Proceed with micro, UCX, GC testing.  For signs of infection start antibiotic.  Given current  concern discussed follow-up with urology.  Referral placed.  Given strict precautions.  Return if symptoms worsen or fail to improve.   Deeann Saint, MD

## 2023-03-13 LAB — URINE CULTURE
MICRO NUMBER:: 15664037
Result:: NO GROWTH
SPECIMEN QUALITY:: ADEQUATE

## 2023-03-13 LAB — URINALYSIS, ROUTINE W REFLEX MICROSCOPIC
Bilirubin Urine: NEGATIVE
Hgb urine dipstick: NEGATIVE
Ketones, ur: NEGATIVE
Leukocytes,Ua: NEGATIVE
Nitrite: NEGATIVE
RBC / HPF: NONE SEEN (ref 0–?)
Specific Gravity, Urine: <=1.005 — AB (ref 1.000–1.030)
Total Protein, Urine: NEGATIVE
Urine Glucose: NEGATIVE
Urobilinogen, UA: 0.2 (ref 0.0–1.0)
WBC, UA: NONE SEEN (ref 0–?)
pH: 6.5 (ref 5.0–8.0)

## 2023-03-13 LAB — C. TRACHOMATIS/N. GONORRHOEAE RNA
C. trachomatis RNA, TMA: NOT DETECTED
N. gonorrhoeae RNA, TMA: NOT DETECTED

## 2023-03-13 NOTE — Telephone Encounter (Signed)
Called and spoke with patient he is aware.

## 2023-03-13 NOTE — Telephone Encounter (Signed)
Over-the-counter Mucinex can be helpful as well as drinking plenty of fluids.

## 2023-04-02 ENCOUNTER — Encounter: Payer: Self-pay | Admitting: Gastroenterology

## 2023-04-24 ENCOUNTER — Ambulatory Visit: Payer: Managed Care, Other (non HMO)

## 2023-04-24 VITALS — Ht 71.0 in | Wt 210.0 lb

## 2023-04-24 DIAGNOSIS — Z1211 Encounter for screening for malignant neoplasm of colon: Secondary | ICD-10-CM

## 2023-04-24 MED ORDER — NA SULFATE-K SULFATE-MG SULF 17.5-3.13-1.6 GM/177ML PO SOLN: 1.0000 | Freq: Once | ORAL | 0 refills | Status: AC

## 2023-04-24 NOTE — Progress Notes (Signed)
No egg or soy allergy known to patient  No issues known to pt with past sedation with any surgeries or procedures Patient denies ever being told they had issues or difficulty with intubation  No FH of Malignant Hyperthermia Pt is not on diet pills Pt is not on  home 02  Pt is not on blood thinners  Pt denies issues with chronic constipation  No A fib or A flutter Have any cardiac testing pending-- no  LOA: independent  Prep: suprep   Patient's chart reviewed by Cathlyn Parsons CNRA prior to previsit and patient appropriate for the [facility].  Previsit completed and red dot placed by patient's name on their procedure day (on provider's schedule).     PV competed with patient. Prep instructions sent via mychart and home address. Goodrx coupon for provided to use for [facility]  price reduction if needed.

## 2023-04-25 ENCOUNTER — Encounter: Payer: Self-pay | Admitting: Gastroenterology

## 2023-05-01 ENCOUNTER — Encounter: Payer: Self-pay | Admitting: Certified Registered Nurse Anesthetist

## 2023-05-02 ENCOUNTER — Ambulatory Visit: Payer: Managed Care, Other (non HMO) | Admitting: Gastroenterology

## 2023-05-02 ENCOUNTER — Encounter: Payer: Self-pay | Admitting: Gastroenterology

## 2023-05-02 VITALS — BP 130/79 | HR 61 | Temp 98.3°F | Resp 16 | Ht 71.0 in | Wt 205.0 lb

## 2023-05-02 DIAGNOSIS — K621 Rectal polyp: Secondary | ICD-10-CM

## 2023-05-02 DIAGNOSIS — D124 Benign neoplasm of descending colon: Secondary | ICD-10-CM

## 2023-05-02 DIAGNOSIS — K635 Polyp of colon: Secondary | ICD-10-CM | POA: Diagnosis not present

## 2023-05-02 DIAGNOSIS — Z1211 Encounter for screening for malignant neoplasm of colon: Secondary | ICD-10-CM | POA: Diagnosis present

## 2023-05-02 DIAGNOSIS — D123 Benign neoplasm of transverse colon: Secondary | ICD-10-CM | POA: Diagnosis not present

## 2023-05-02 DIAGNOSIS — D125 Benign neoplasm of sigmoid colon: Secondary | ICD-10-CM

## 2023-05-02 DIAGNOSIS — D128 Benign neoplasm of rectum: Secondary | ICD-10-CM

## 2023-05-02 DIAGNOSIS — K641 Second degree hemorrhoids: Secondary | ICD-10-CM | POA: Diagnosis not present

## 2023-05-02 MED ORDER — SODIUM CHLORIDE 0.9 % IV SOLN: 500.0000 mL | Freq: Once | INTRAVENOUS | Status: DC

## 2023-05-02 NOTE — Progress Notes (Signed)
Called to room to assist during endoscopic procedure.  Patient ID and intended procedure confirmed with present staff. Received instructions for my participation in the procedure from the performing physician.  

## 2023-05-02 NOTE — Op Note (Signed)
[facility] Patient Name: Roger Fowler Procedure Date: 05/02/2023 2:45 PM MRN: [id number] Endoscopist: Doristine Locks , MD, [id number] Age: 57 Referring MD:  Date of Birth: 1965/05/25 Gender: Male Account #: [id number] Procedure:                Colonoscopy Indications:              Screening for colorectal malignant neoplasm, This                            is the patient's first colonoscopy Medicines:                Monitored Anesthesia Care Procedure:                Pre-Anesthesia Assessment:                           - Prior to the procedure, a History and Physical                            was performed, and patient medications and                            allergies were reviewed. The patient's tolerance of                            previous anesthesia was also reviewed. The risks                            and benefits of the procedure and the sedation                            options and risks were discussed with the patient.                            All questions were answered, and informed consent                            was obtained. Prior Anticoagulants: The patient has                            taken no anticoagulant or antiplatelet agents. ASA                            Grade Assessment: II - A patient with mild systemic                            disease. After reviewing the risks and benefits,                            the patient was deemed in satisfactory condition to                            undergo the procedure.  After obtaining informed consent, the colonoscope                            was passed under direct vision. Throughout the                            procedure, the patient's blood pressure, pulse, and                            oxygen saturations were monitored continuously. The                            CF [id number] #[id number] was introduced through the anus                            and advanced to the  the cecum, identified by                            appendiceal orifice and ileocecal valve. The                            colonoscopy was performed without difficulty. The                            patient tolerated the procedure well. The quality                            of the bowel preparation was good. The ileocecal                            valve, appendiceal orifice, and rectum were                            photographed. Scope In: 3:17:42 PM Scope Out: 3:41:18 PM Scope Withdrawal Time: 0 hours 20 minutes 50 seconds  Total Procedure Duration: 0 hours 23 minutes 36 seconds  Findings:                 The perianal and digital rectal examinations were                            normal.                           Three sessile polyps were found in the descending                            colon and transverse colon. The polyps were 3 to 5                            mm in size. These polyps were removed with a cold                            snare. Resection and retrieval were complete.  Estimated blood loss was minimal.                           Multiple sessile polyps were found in the rectum,                            recto-sigmoid colon, and distal sigmoid colon. The                            polyps were 1 to 3 mm in size. 9 of these polyps                            were removed with a cold snare for histologic                            representative evaluation. Resection and retrieval                            were complete. Estimated blood loss was minimal.                           Non-bleeding internal hemorrhoids were found during                            retroflexion. The hemorrhoids were small and Grade                            II (internal hemorrhoids that prolapse but reduce                            spontaneously). Complications:            No immediate complications. Estimated Blood Loss:     Estimated blood loss was  minimal. Impression:               - Three 3 to 5 mm polyps in the descending colon                            and in the transverse colon, removed with a cold                            snare. Resected and retrieved.                           - Multiple 1 to 3 mm polyps in the rectum, at the                            recto-sigmoid colon and in the distal sigmoid                            colon, removed with a cold snare. Resected and                            retrieved.                           -  Non-bleeding internal hemorrhoids. Recommendation:           - Patient has a contact number available for                            emergencies. The signs and symptoms of potential                            delayed complications were discussed with the                            patient. Return to normal activities tomorrow.                            Written discharge instructions were provided to the                            patient.                           - Resume previous diet.                           - Continue present medications.                           - Await pathology results.                           - Repeat colonoscopy for surveillance based on                            pathology results.                           - Return to GI clinic PRN.                           - Use fiber, for example Citrucel, Fibercon, Konsyl                            or Metamucil.                           - Internal hemorrhoids were noted on this study and                            may be amenable to hemorrhoid band ligation. If you                            are interested in further treatment of these                            hemorrhoids with band ligation, please contact my                            clinic to set  up an appointment for evaluation and                            treatment. Doristine Locks, MD 05/02/2023 3:50:47 PM

## 2023-05-02 NOTE — Progress Notes (Signed)
To pacu, Vss. Report to RN.tb

## 2023-05-02 NOTE — Progress Notes (Signed)
Pt's states no medical or surgical changes since previsit or office visit. 

## 2023-05-02 NOTE — Progress Notes (Signed)
GASTROENTEROLOGY PROCEDURE H&P NOTE   Primary Care Physician: Deeann Saint, MD    Reason for Procedure:  Colon Cancer screening  Plan:    Colonoscopy  Patient is appropriate for endoscopic procedure(s) in the ambulatory ([facility]) setting.  The nature of the procedure, as well as the risks, benefits, and alternatives were carefully and thoroughly reviewed with the patient. Ample time for discussion and questions allowed. The patient understood, was satisfied, and agreed to proceed.     HPI: Roger Fowler is a 57 y.o. male who presents for colonoscopy for routine Colon Cancer screening.  No active GI symptoms.  No known family history of colon cancer or related malignancy.  Patient is otherwise without complaints or active issues today.  Past Medical History:  Diagnosis Date   Arthritis    Eczema    Hyperlipidemia    Psoriasis    Tobacco use     History reviewed. No pertinent surgical history.  Prior to Admission medications   Medication Sig Start Date End Date Taking? Authorizing Provider  ALPRAZolam Prudy Feeler) 0.5 MG tablet Take 1 tablet (0.5 mg total) by mouth 2 (two) times daily as needed for anxiety. 01/10/23  Yes Deeann Saint, MD  pravastatin (PRAVACHOL) 80 MG tablet Take 1 tablet (80 mg total) by mouth daily. 01/10/23  Yes Deeann Saint, MD  doxepin (SINEQUAN) 25 MG capsule Take 25-50 mg by mouth at bedtime. Patient not taking: Reported on 05/02/2023 08/19/22   [provider]  fluticasone (FLONASE) 50 MCG/ACT nasal spray Place 2 sprays into both nostrils daily. 09/14/21   Deeann Saint, MD  gentamicin ointment (GARAMYCIN) 0.1 % Apply 1 Application topically daily. 04/01/22   Vivi Barrack, DPM  ipratropium (ATROVENT) 0.03 % nasal spray Place 2 sprays into both nostrils 3 (three) times daily. 01/27/21   [provider]  Naftifine HCl (NAFTIN) 2 % GEL Apply 1 Application topically daily. 03/12/22   Vivi Barrack, DPM    Current  Outpatient Medications  Medication Sig Dispense Refill   ALPRAZolam (XANAX) 0.5 MG tablet Take 1 tablet (0.5 mg total) by mouth 2 (two) times daily as needed for anxiety. 60 tablet 1   pravastatin (PRAVACHOL) 80 MG tablet Take 1 tablet (80 mg total) by mouth daily. 90 tablet 3   doxepin (SINEQUAN) 25 MG capsule Take 25-50 mg by mouth at bedtime. (Patient not taking: Reported on 05/02/2023)     fluticasone (FLONASE) 50 MCG/ACT nasal spray Place 2 sprays into both nostrils daily. 16 g 6   gentamicin ointment (GARAMYCIN) 0.1 % Apply 1 Application topically daily. 15 g 0   ipratropium (ATROVENT) 0.03 % nasal spray Place 2 sprays into both nostrils 3 (three) times daily.     Naftifine HCl (NAFTIN) 2 % GEL Apply 1 Application topically daily. 60 g 1   Current Facility-Administered Medications  Medication Dose Route Frequency Provider Last Rate Last Admin   0.9 %  sodium chloride infusion  500 mL Intravenous Once Lilliane Sposito V, DO        Allergies as of 05/02/2023 - Review Complete 05/02/2023  Allergen Reaction Noted   Latex Hives and Itching 09/14/2021    Family History  Problem Relation Age of Onset   Hypertension Mother    Cancer Mother    Hypertension Brother    Diabetes Mellitus II Neg Hx    Colon cancer Neg Hx    Prostate cancer Neg Hx    Colon polyps Neg Hx  Rectal cancer Neg Hx     Social History   Socioeconomic History   Marital status: Married    Spouse name: Not on file   Number of children: Not on file   Years of education: Not on file   Highest education level: Not on file  Occupational History   Not on file  Tobacco Use   Smoking status: Every Day    Current packs/day: 1.00    Average packs/day: 1 pack/day for 27.0 years (27.0 ttl pk-yrs)    Types: Cigarettes   Smokeless tobacco: Never  Vaping Use   Vaping status: Never Used  Substance and Sexual Activity   Alcohol use: Yes    Alcohol/week: 1.0 standard drink of alcohol    Types: 1 Cans of beer per  week    Comment: 1 beer everyday   Drug use: No   Sexual activity: Not on file  Other Topics Concern   Not on file  Social History Narrative   Occupation:  Dealer for [employer]   Current Smoker   Divorced   One son age 90 - currently serving in [employer]. Deployed in [place]         Social Drivers of Corporate investment banker Strain: Not on [employer] Insecurity: Not on file  Transportation Needs: Not on file  Physical Activity: Not on file  Stress: Not on file  Social Connections: Not on file  Intimate Partner Violence: Not on file    Physical Exam: Vital signs in last 24 hours: @BP  139/73   Pulse 76   Temp 98.3 F (36.8 C) (Temporal)   Ht 5\' 11"  (1.803 m)   Wt 205 lb (93 kg)   SpO2 99%   BMI 28.59 kg/m  GEN: NAD EYE: Sclerae anicteric ENT: MMM CV: Non-tachycardic Pulm: CTA b/l GI: Soft, NT/ND NEURO:  Alert & Oriented x 3   Doristine Locks, DO [facility]   05/02/2023 3:06 PM

## 2023-05-02 NOTE — Patient Instructions (Signed)
Thank you for letting us take care of your healthcare needs today. Please see handouts given to you on Polyps and Hemorrhoids.    YOU HAD AN ENDOSCOPIC PROCEDURE TODAY AT THE [facility]:   Refer to the procedure report that was given to you for any specific questions about what was found during the examination.  If the procedure report does not answer your questions, please call your gastroenterologist to clarify.  If you requested that your care partner not be given the details of your procedure findings, then the procedure report has been included in a sealed envelope for you to review at your convenience later.  YOU SHOULD EXPECT: Some feelings of bloating in the abdomen. Passage of more gas than usual.  Walking can help get rid of the air that was put into your GI tract during the procedure and reduce the bloating. If you had a lower endoscopy (such as a colonoscopy or flexible sigmoidoscopy) you may notice spotting of blood in your stool or on the toilet paper. If you underwent a bowel prep for your procedure, you may not have a normal bowel movement for a few days.  Please Note:  You might notice some irritation and congestion in your nose or some drainage.  This is from the oxygen used during your procedure.  There is no need for concern and it should clear up in a day or so.  SYMPTOMS TO REPORT IMMEDIATELY:  Following lower endoscopy (colonoscopy or flexible sigmoidoscopy):  Excessive amounts of blood in the stool  Significant tenderness or worsening of abdominal pains  Swelling of the abdomen that is new, acute  Fever of 100F or higher   For urgent or emergent issues, a gastroenterologist can be reached at any hour by calling [phone]. Do not use MyChart messaging for urgent concerns.    DIET:  We do recommend a small meal at first, but then you may proceed to your regular diet.  Drink plenty of fluids but you should avoid alcoholic beverages for 24  hours.  ACTIVITY:  You should plan to take it easy for the rest of today and you should NOT DRIVE or use heavy machinery until tomorrow (because of the sedation medicines used during the test).    FOLLOW UP: Our staff will call the number listed on your records the next business day following your procedure.  We will call around 7:15- 8:00 am to check on you and address any questions or concerns that you may have regarding the information given to you following your procedure. If we do not reach you, we will leave a message.     If any biopsies were taken you will be contacted by phone or by letter within the next 1-3 weeks.  Please call us at [phone] if you have not heard about the biopsies in 3 weeks.    SIGNATURES/CONFIDENTIALITY: You and/or your care partner have signed paperwork which will be entered into your electronic medical record.  These signatures attest to the fact that that the information above on your After Visit Summary has been reviewed and is understood.  Full responsibility of the confidentiality of this discharge information lies with you and/or your care-partner. 

## 2023-05-06 ENCOUNTER — Telehealth: Payer: Self-pay

## 2023-05-06 NOTE — Telephone Encounter (Signed)
Left message on follow up call. 

## 2023-05-09 LAB — SURGICAL PATHOLOGY

## 2023-06-02 ENCOUNTER — Telehealth: Payer: Self-pay

## 2023-06-02 NOTE — Telephone Encounter (Signed)
Called and Patient is aware he would need to be seen.

## 2023-06-02 NOTE — Telephone Encounter (Signed)
Copied from CRM [id number]. Topic: Clinical - Medical Advice >> Jun 02, 2023  3:03 PM Dennison Nancy wrote: Reason for CRM: Patient requesting if Abbe Amsterdam MD can prescribe some medication , started last few days ago been working around a lot of dust , possible sinus stuffiness in nose and pressures, when lay down getting headaches . Please call patient to let know   [id number] - [place], [place] - [address] [facility] [address] [place] [address] Phone: [phone] Fax: [phone]

## 2023-06-06 ENCOUNTER — Telehealth (INDEPENDENT_AMBULATORY_CARE_PROVIDER_SITE_OTHER): Payer: Managed Care, Other (non HMO) | Admitting: Family Medicine

## 2023-06-06 ENCOUNTER — Encounter: Payer: Self-pay | Admitting: Family Medicine

## 2023-06-06 DIAGNOSIS — J019 Acute sinusitis, unspecified: Secondary | ICD-10-CM | POA: Diagnosis not present

## 2023-06-06 MED ORDER — AMOXICILLIN-POT CLAVULANATE 500-125 MG PO TABS: 1.0000 | ORAL_TABLET | Freq: Two times a day (BID) | ORAL | 0 refills | Status: AC

## 2023-06-06 NOTE — Progress Notes (Signed)
Patient was unable to self-report due to a lack of equipment at home via telehealth

## 2023-06-06 NOTE — Progress Notes (Signed)
Virtual Visit via Video Note  I connected with Mayo Ao on 06/06/23 at  2:30 PM EST by a video enabled telemedicine application and verified that I am speaking with the correct person using two identifiers.  Location patient: home Location provider:work or home office Persons participating in the virtual visit: patient, provider  I discussed the limitations of evaluation and management by telemedicine and the availability of in person appointments. The patient expressed understanding and agreed to proceed. Chief Complaint  Patient presents with   Cough    Cough and congestion       HPI: Patient is a 58 year old male seen for acute illness.  Patient states he typically has sinus issues each year around this time.  Endorses being outside in the yard over Christmas which may have started things.  Pt with pressure around nose and congestion x a wk or more.  This week pt started getting HAs and noticing increased pressure in head with laying down.  Tried flonase.  Patient denies ear pain/pressure, rhinorrhea, sore throat, cough  ROS: See pertinent positives and negatives per HPI.  Past Medical History:  Diagnosis Date   Arthritis    Eczema    Hyperlipidemia    Psoriasis    Tobacco use     History reviewed. No pertinent surgical history.  Family History  Problem Relation Age of Onset   Hypertension Mother    Cancer Mother    Hypertension Brother    Diabetes Mellitus II Neg Hx    Colon cancer Neg Hx    Prostate cancer Neg Hx    Colon polyps Neg Hx    Rectal cancer Neg Hx      Current Outpatient Medications:    ALPRAZolam (XANAX) 0.5 MG tablet, Take 1 tablet (0.5 mg total) by mouth 2 (two) times daily as needed for anxiety., Disp: 60 tablet, Rfl: 1   doxepin (SINEQUAN) 25 MG capsule, Take 25-50 mg by mouth at bedtime., Disp: , Rfl:    fluticasone (FLONASE) 50 MCG/ACT nasal spray, Place 2 sprays into both nostrils daily., Disp: 16 g, Rfl: 6   gentamicin ointment  (GARAMYCIN) 0.1 %, Apply 1 Application topically daily., Disp: 15 g, Rfl: 0   ipratropium (ATROVENT) 0.03 % nasal spray, Place 2 sprays into both nostrils 3 (three) times daily., Disp: , Rfl:    Naftifine HCl (NAFTIN) 2 % GEL, Apply 1 Application topically daily., Disp: 60 g, Rfl: 1   pravastatin (PRAVACHOL) 80 MG tablet, Take 1 tablet (80 mg total) by mouth daily., Disp: 90 tablet, Rfl: 3  EXAM:  VITALS per patient if applicable: RR between 12-20 bpm  GENERAL: alert, oriented, appears well and in no acute distress  HEENT: atraumatic, conjunctiva clear, no obvious abnormalities on inspection of external nose and ears  NECK: normal movements of the head and neck  LUNGS: on inspection no signs of respiratory distress, breathing rate appears normal, no obvious gross SOB, gasping or wheezing  CV: no obvious cyanosis  MS: moves all visible extremities without noticeable abnormality  PSYCH/NEURO: pleasant and cooperative, no obvious depression or anxiety, speech and thought processing grossly intact  ASSESSMENT AND PLAN:  Discussed the following assessment and plan:  Acute sinusitis, recurrence not specified, unspecified location - Plan: amoxicillin-clavulanate (AUGMENTIN) 500-125 MG tablet  Start ABX for sinusitis.  Continue supportive care with OTC antihistamines, Flonase, steam from shower, Tylenol or NSAIDs as needed for pain.  Follow-up for continued or worsening symptoms.   I discussed the assessment and treatment plan  with the patient. The patient was provided an opportunity to ask questions and all were answered. The patient agreed with the plan and demonstrated an understanding of the instructions.   The patient was advised to call back or seek an in-person evaluation if the symptoms worsen or if the condition fails to improve as anticipated.   Deeann Saint, MD

## 2023-07-14 ENCOUNTER — Encounter: Payer: Self-pay | Admitting: Family Medicine

## 2023-07-14 ENCOUNTER — Ambulatory Visit: Payer: Managed Care, Other (non HMO) | Admitting: Family Medicine

## 2023-07-14 VITALS — BP 142/74 | HR 92 | Temp 98.4°F | Ht 71.0 in | Wt 204.6 lb

## 2023-07-14 DIAGNOSIS — L409 Psoriasis, unspecified: Secondary | ICD-10-CM

## 2023-07-14 DIAGNOSIS — F419 Anxiety disorder, unspecified: Secondary | ICD-10-CM

## 2023-07-14 DIAGNOSIS — R030 Elevated blood-pressure reading, without diagnosis of hypertension: Secondary | ICD-10-CM

## 2023-07-14 DIAGNOSIS — L03116 Cellulitis of left lower limb: Secondary | ICD-10-CM | POA: Diagnosis not present

## 2023-07-14 DIAGNOSIS — E782 Mixed hyperlipidemia: Secondary | ICD-10-CM

## 2023-07-14 MED ORDER — ALPRAZOLAM 0.5 MG PO TABS: 0.5000 mg | ORAL_TABLET | Freq: Two times a day (BID) | ORAL | 1 refills | Status: DC | PRN

## 2023-07-14 MED ORDER — AMOXICILLIN-POT CLAVULANATE 500-125 MG PO TABS: 1.0000 | ORAL_TABLET | Freq: Two times a day (BID) | ORAL | 0 refills | Status: AC

## 2023-07-14 NOTE — Progress Notes (Addendum)
 Established Patient Office Visit   Subjective  Patient ID: Roger Fowler, male    DOB: Jan 18, 1966  Age: 58 y.o. MRN: [id number]  Chief Complaint  Patient presents with   Psoriasis    Started 3 weeks ago, itch burning rash, drainage, on ankles an feet     Patient is a 58 year old male seen for ongoing concern.  Patient with rash on bilateral feet and ankles x 3 weeks.  Patient notes pruritus, burning, and drainage of skin.  States psoriasis is flaring.  Scratching legs.  Area on L ankle that rubs on shoe was draining. Has several steroid creams at home, a few of which cause burning.  Now ready to consider dupixent.  Cannot get into dermatology until 07/28/2023.    Pt requesting refill on xanax for anxiety.  Notes increased stress at work as several ppl were laid off and pt now has extra responsibilities.  Pt also caring for his sister who has special needs since their mother passed.    Patient Active Problem List   Diagnosis Date Noted   Back pain 09/09/2014   Neck pain 09/09/2014   Preventative health care 12/10/2013   Change in bowel habits 12/10/2013   TOBACCO USE 01/09/2010   PSORIASIS 06/12/2007   Past Medical History:  Diagnosis Date   Arthritis    Eczema    Hyperlipidemia    Psoriasis    Tobacco use    History reviewed. No pertinent surgical history. Social History   Tobacco Use   Smoking status: Every Day    Current packs/day: 1.00    Average packs/day: 1 pack/day for 27.0 years (27.0 ttl pk-yrs)    Types: Cigarettes   Smokeless tobacco: Never  Vaping Use   Vaping status: Never Used  Substance Use Topics   Alcohol use: Yes    Alcohol/week: 1.0 standard drink of alcohol    Types: 1 Cans of beer per week    Comment: 1 beer everyday   Drug use: No   Family History  Problem Relation Age of Onset   Hypertension Mother    Cancer Mother    Hypertension Brother    Diabetes Mellitus II Neg Hx    Colon cancer Neg Hx    Prostate cancer Neg Hx    Colon polyps  Neg Hx    Rectal cancer Neg Hx    Allergies  Allergen Reactions   Latex Hives and Itching      ROS Negative unless stated above    Objective:     BP (!) 142/74 (BP Location: Left Arm, Patient Position: Sitting, Cuff Size: Large)   Pulse 92   Temp 98.4 F (36.9 C) (Oral)   Ht 5\' 11"  (1.803 m)   Wt 204 lb 9.6 oz (92.8 kg)   SpO2 99%   BMI 28.54 kg/m  BP Readings from Last 3 Encounters:  07/14/23 (!) 142/74  05/02/23 130/79  03/12/23 136/72   Wt Readings from Last 3 Encounters:  07/14/23 204 lb 9.6 oz (92.8 kg)  05/02/23 205 lb (93 kg)  04/24/23 210 lb (95.3 kg)      Physical Exam Constitutional:      General: He is not in acute distress.    Appearance: Normal appearance.  HENT:     Head: Normocephalic and atraumatic.     Nose: Nose normal.     Mouth/Throat:     Mouth: Mucous membranes are moist.  Cardiovascular:     Rate and Rhythm: Normal rate and regular  rhythm.     Heart sounds: Normal heart sounds. No murmur heard.    No gallop.  Pulmonary:     Effort: Pulmonary effort is normal. No respiratory distress.     Breath sounds: Normal breath sounds. No wheezing, rhonchi or rales.  Skin:    General: Skin is warm and dry.     Comments: Hyperpigmented circular lesions of b/l lower legs.  L medial ankle with hyperpigmented plaque with broken skin, eschar, no drainage.  Mild increased warmth and erythema.  Neurological:     Mental Status: He is alert and oriented to person, place, and time.       07/14/2023    4:42 PM 06/06/2023    2:27 PM 01/10/2023    2:50 PM 09/14/2021    4:16 PM  GAD 7 : Generalized Anxiety Score  Nervous, Anxious, on Edge 0 0 2 1  Control/stop worrying 0 0 1 1  Worry too much - different things 0 0 1 3  Trouble relaxing 0 0 1 2  Restless 0 0 2 1  Easily annoyed or irritable 0 2 2 1   Afraid - awful might happen 0 2 0 0  Total GAD 7 Score 0 4 9 9   Anxiety Difficulty Not difficult at all  Not difficult at all Not difficult at all       07/14/2023    4:42 PM 06/06/2023    2:26 PM 01/10/2023    2:49 PM  Depression screen PHQ 2/9  Decreased Interest 1 0 0  Down, Depressed, Hopeless 1 0 0  PHQ - 2 Score 2 0 0  Altered sleeping 2  1  Tired, decreased energy 1  1  Change in appetite 1  1  Feeling bad or failure about yourself  0  0  Trouble concentrating 0  0  Moving slowly or fidgety/restless 0  1  Suicidal thoughts 0  0  PHQ-9 Score 6  4  Difficult doing work/chores Not difficult at all  Not difficult at all      No results found for any visits on 07/14/23.    Assessment & Plan:  Psoriasis -current flare on b/l LEs. -pt to use steroid cream that does not cause burning/irritation.  Has at home.  No new rx given as pt does not recall name. -concern for cellulitis.  Start abx -ok to use OTC po antihistamine for pruritus. -keep upcoming Derm appt 3/17.  Discussed dupixent vs other options.  Cellulitis of left lower extremity -     Amoxicillin-Pot Clavulanate; Take 1 tablet by mouth in the morning and at bedtime for 7 days.  Dispense: 14 tablet; Refill: 0  Anxiety -     ALPRAZolam; Take 1 tablet (0.5 mg total) by mouth 2 (two) times daily as needed for anxiety.  Dispense: 60 tablet; Refill: 1  Mixed hyperlipidemia -continue lifestyle modifications -continue pravastatin 80 mg daily.  Elevated blood pressure reading without diagnosis of hypertension -recheck -discussed lifestyle modifications including smoking cessation. -monitor at home.  For elevations consistently >140/90 start medication.  Return if symptoms worsen or fail to improve.   Deeann Saint, MD

## 2023-07-22 ENCOUNTER — Telehealth: Payer: Self-pay

## 2023-07-22 NOTE — Telephone Encounter (Signed)
 Spoke with patient about labs will talk to [facility] about it and get back with patient tomorrow

## 2023-07-22 NOTE — Telephone Encounter (Signed)
 Copied from CRM [id number]. Topic: Clinical - Request for Lab/Test Order >> Jul 22, 2023  2:39 PM Isabell A wrote: Reason for CRM: Patient would like to confirm if he can have labs completed at the office - he has orders from his dermatologist. Requesting a call back.

## 2023-07-23 NOTE — Telephone Encounter (Signed)
 Called patient left a Detailed VM per DPR, Per Dr. Salomon Fick patient would need to get labs done at a [facility]

## 2023-08-07 ENCOUNTER — Telehealth: Payer: Self-pay | Admitting: *Deleted

## 2023-08-07 NOTE — Telephone Encounter (Signed)
 Copied from CRM [id number]. Topic: General - Other >> Aug 07, 2023 12:48 PM Elizebeth Brooking wrote: Reason for CRM: Patient called in stating stating he would like to speak with Kara Mead, would like for her to give him a callback  [phone]

## 2023-08-11 NOTE — Telephone Encounter (Signed)
Called patient left a VM to return call

## 2023-09-23 ENCOUNTER — Telehealth: Payer: Self-pay

## 2023-09-23 NOTE — Telephone Encounter (Signed)
 Copied from CRM [id number]. Topic: Clinical - Medication Question >> Sep 22, 2023  4:32 PM Geneva B wrote: Reason for CRM: patient wants to know if there is another rx instead of pravastatin  (PRAVACHOL ) 80 MG tablet that he can take please call patient [phone]

## 2023-09-23 NOTE — Telephone Encounter (Signed)
 Is pt having an issue with pravastatin ?

## 2023-09-24 ENCOUNTER — Telehealth: Payer: Self-pay | Admitting: *Deleted

## 2023-09-24 NOTE — Telephone Encounter (Signed)
 Called patient left a VM per DPR,  to return call.

## 2023-09-24 NOTE — Telephone Encounter (Signed)
 Can try taking pravastatin  twice a week.

## 2023-09-24 NOTE — Telephone Encounter (Signed)
 Copied from CRM [id number]. Topic: General - Other >> Sep 24, 2023  3:19 PM Shardie S wrote: Reason for CRM: Att: Leah-Patient returning missed phone call. States best time to call hime back is after 12:30 pm Callback # [phone]

## 2023-09-25 NOTE — Telephone Encounter (Signed)
 Called patient left a VM per patient, patent is aware

## 2023-10-02 ENCOUNTER — Ambulatory Visit: Admitting: Family Medicine

## 2023-10-02 ENCOUNTER — Encounter: Payer: Self-pay | Admitting: Family Medicine

## 2023-10-02 VITALS — BP 142/80 | HR 79 | Temp 97.6°F | Ht 71.0 in | Wt 203.0 lb

## 2023-10-02 DIAGNOSIS — R634 Abnormal weight loss: Secondary | ICD-10-CM

## 2023-10-02 DIAGNOSIS — R109 Unspecified abdominal pain: Secondary | ICD-10-CM

## 2023-10-02 DIAGNOSIS — F17210 Nicotine dependence, cigarettes, uncomplicated: Secondary | ICD-10-CM | POA: Diagnosis not present

## 2023-10-02 DIAGNOSIS — L409 Psoriasis, unspecified: Secondary | ICD-10-CM | POA: Diagnosis not present

## 2023-10-02 LAB — CBC WITH DIFFERENTIAL/PLATELET
Basophils Absolute: 0 10*3/uL (ref 0.0–0.1)
Basophils Relative: 0.5 % (ref 0.0–3.0)
Eosinophils Absolute: 0 10*3/uL (ref 0.0–0.7)
Eosinophils Relative: 0.3 % (ref 0.0–5.0)
HCT: 44.6 % (ref 39.0–52.0)
Hemoglobin: 15 g/dL (ref 13.0–17.0)
Lymphocytes Relative: 28.1 % (ref 12.0–46.0)
Lymphs Abs: 1.7 10*3/uL (ref 0.7–4.0)
MCHC: 33.5 g/dL (ref 30.0–36.0)
MCV: 82 fl (ref 78.0–100.0)
Monocytes Absolute: 0.6 10*3/uL (ref 0.1–1.0)
Monocytes Relative: 9.8 % (ref 3.0–12.0)
Neutro Abs: 3.8 10*3/uL (ref 1.4–7.7)
Neutrophils Relative %: 61.3 % (ref 43.0–77.0)
Platelets: 205 10*3/uL (ref 150.0–400.0)
RBC: 5.44 Mil/uL (ref 4.22–5.81)
RDW: 15.2 % (ref 11.5–15.5)
WBC: 6.1 10*3/uL (ref 4.0–10.5)

## 2023-10-02 LAB — COMPREHENSIVE METABOLIC PANEL WITH GFR
ALT: 20 U/L (ref 0–53)
AST: 24 U/L (ref 0–37)
Albumin: 4.4 g/dL (ref 3.5–5.2)
Alkaline Phosphatase: 79 U/L (ref 39–117)
BUN: 12 mg/dL (ref 6–23)
CO2: 24 meq/L (ref 19–32)
Calcium: 9.4 mg/dL (ref 8.4–10.5)
Chloride: 106 meq/L (ref 96–112)
Creatinine, Ser: 0.95 mg/dL (ref 0.40–1.50)
GFR: 89.12 mL/min (ref >60.00)
Glucose, Bld: 88 mg/dL (ref 70–99)
Potassium: 4.2 meq/L (ref 3.5–5.1)
Sodium: 136 meq/L (ref 135–145)
Total Bilirubin: 0.8 mg/dL (ref 0.2–1.2)
Total Protein: 7.2 g/dL (ref 6.0–8.3)

## 2023-10-02 LAB — POC URINALSYSI DIPSTICK (AUTOMATED)
Bilirubin, UA: NEGATIVE
Blood, UA: NEGATIVE
Glucose, UA: NEGATIVE
Ketones, UA: NEGATIVE
Leukocytes, UA: NEGATIVE
Nitrite, UA: NEGATIVE
Protein, UA: NEGATIVE
Spec Grav, UA: 1.01 (ref 1.010–1.025)
Urobilinogen, UA: 0.2 U/dL
pH, UA: 6 (ref 5.0–8.0)

## 2023-10-02 LAB — TSH: TSH: 1.2 u[IU]/mL (ref 0.35–5.50)

## 2023-10-02 LAB — PSA: PSA: 0.33 ng/mL (ref 0.10–4.00)

## 2023-10-02 LAB — T4, FREE: Free T4: 0.83 ng/dL (ref 0.60–1.60)

## 2023-10-02 LAB — HEMOGLOBIN A1C: Hgb A1c MFr Bld: 5.9 % (ref 4.6–6.5)

## 2023-10-02 LAB — URINALYSIS, MICROSCOPIC ONLY
RBC / HPF: NONE SEEN (ref 0–?)
WBC, UA: NONE SEEN (ref 0–?)

## 2023-10-02 NOTE — Progress Notes (Signed)
 Established Patient Office Visit   Subjective  Patient ID: Roger Fowler, male    DOB: 05-25-1965  Age: 58 y.o. MRN: [id number]  Chief Complaint  Patient presents with   Medical Management of Chronic Issues    Right side flank pain, started a week ago, patient feels like it might be his appendix, patient denies any pain at this time    Patient is a 58 year old male seen for acute concern.  Patient endorses a tight, grabbing sensation in the right side x 1 week.  Symptoms started last Tuesday and Rafeek increased by Friday.  Over the weekend pain became 9-10/10.  Briefly relieved by Tylenol.  Now notices a faint, short pain in right flank.  Patient denies nausea, vomiting.  Has noticed daily loose stools since starting Dupixent.  Patient concerned he has appendicitis.  Patient also mentions unintentional weight loss.  States he was initially trying to lose weight.    Patient Active Problem List   Diagnosis Date Noted   Back pain 09/09/2014   Neck pain 09/09/2014   Preventative health care 12/10/2013   Change in bowel habits 12/10/2013   TOBACCO USE 01/09/2010   PSORIASIS 06/12/2007   Past Medical History:  Diagnosis Date   Arthritis    Eczema    Hyperlipidemia    Psoriasis    Tobacco use    History reviewed. No pertinent surgical history. Social History   Tobacco Use   Smoking status: Every Day    Current packs/day: 1.00    Average packs/day: 1 pack/day for 27.0 years (27.0 ttl pk-yrs)    Types: Cigarettes   Smokeless tobacco: Never  Vaping Use   Vaping status: Never Used  Substance Use Topics   Alcohol use: Yes    Alcohol/week: 1.0 standard drink of alcohol    Types: 1 Cans of beer per week    Comment: 1 beer everyday   Drug use: No   Family History  Problem Relation Age of Onset   Hypertension Mother    Cancer Mother    Hypertension Brother    Diabetes Mellitus II Neg Hx    Colon cancer Neg Hx    Prostate cancer Neg Hx    Colon polyps Neg Hx    Rectal  cancer Neg Hx    Allergies  Allergen Reactions   Latex Hives and Itching    ROS Negative unless stated above    Objective:      BP (!) 142/80 (BP Location: Left Arm, Patient Position: Sitting, Cuff Size: Normal)   Pulse 79   Temp 97.6 F (36.4 C) (Oral)   Ht 5\' 11"  (1.803 m)   Wt 203 lb (92.1 kg)   BMI 28.31 kg/m  BP Readings from Last 3 Encounters:  10/02/23 (!) 142/80  07/14/23 (!) 142/74  05/02/23 130/79   Wt Readings from Last 3 Encounters:  10/02/23 203 lb (92.1 kg)  07/14/23 204 lb 9.6 oz (92.8 kg)  05/02/23 205 lb (93 kg)      Physical Exam Constitutional:      General: He is not in acute distress.    Appearance: Normal appearance.  HENT:     Head: Normocephalic and atraumatic.     Nose: Nose normal.     Mouth/Throat:     Mouth: Mucous membranes are moist.  Cardiovascular:     Rate and Rhythm: Normal rate and regular rhythm.     Heart sounds: Normal heart sounds. No murmur heard.    No gallop.  Pulmonary:     Effort: Pulmonary effort is normal. No respiratory distress.     Breath sounds: Normal breath sounds. No wheezing, rhonchi or rales.  Abdominal:     General: Bowel sounds are normal.     Palpations: Abdomen is soft.     Tenderness: There is no abdominal tenderness. There is no right CVA tenderness, left CVA tenderness, guarding or rebound. Negative signs include Murphy's sign, Rovsing's sign, McBurney's sign and psoas sign.  Skin:    General: Skin is warm and dry.  Neurological:     Mental Status: He is alert and oriented to person, place, and time.        07/14/2023    4:42 PM 06/06/2023    2:26 PM 01/10/2023    2:49 PM  Depression screen PHQ 2/9  Decreased Interest 1 0 0  Down, Depressed, Hopeless 1 0 0  PHQ - 2 Score 2 0 0  Altered sleeping 2  1  Tired, decreased energy 1  1  Change in appetite 1  1  Feeling bad or failure about yourself  0  0  Trouble concentrating 0  0  Moving slowly or fidgety/restless 0  1  Suicidal thoughts  0  0  PHQ-9 Score 6  4  Difficult doing work/chores Not difficult at all  Not difficult at all      07/14/2023    4:42 PM 06/06/2023    2:27 PM 01/10/2023    2:50 PM 09/14/2021    4:16 PM  GAD 7 : Generalized Anxiety Score  Nervous, Anxious, on Edge 0 0 2 1  Control/stop worrying 0 0 1 1  Worry too much - different things 0 0 1 3  Trouble relaxing 0 0 1 2  Restless 0 0 2 1  Easily annoyed or irritable 0 2 2 1   Afraid - awful might happen 0 2 0 0  Total GAD 7 Score 0 4 9 9   Anxiety Difficulty Not difficult at all  Not difficult at all Not difficult at all     Results for orders placed or performed in visit on 10/02/23  POCT Urinalysis Dipstick (Automated)  Result Value Ref Range   Color, UA yellow    Clarity, UA clear    Glucose, UA Negative Negative   Bilirubin, UA neg    Ketones, UA neg    Spec Grav, UA 1.010 1.010 - 1.025   Blood, UA neg    pH, UA 6.0 5.0 - 8.0   Protein, UA Negative Negative   Urobilinogen, UA 0.2 0.2 or 1.0 E.U./dL   Nitrite, UA neg    Leukocytes, UA Negative Negative      Assessment & Plan:   Acute right flank pain -     POCT Urinalysis Dipstick (Automated) -     CBC with Differential/Platelet -     Comprehensive metabolic panel with GFR -     Urine Microscopic -     PSA  Unintentional weight loss -     CBC with Differential/Platelet -     Comprehensive metabolic panel with GFR -     TSH -     T4, free -     Hemoglobin A1c -     PSA  Psoriasis  Cigarette nicotine dependence without complication  Patient with acute right-sided flank pain.  Discussed possible causes of symptoms including constipation, diarrhea, renal calculi, UTI, muscle strain, appendicitis, diverticulitis.  POC UA negative.  Will obtain urine micro and labs.  Given strict precautions for worsening symptoms.  Patient concerned about weight loss.  Weight in clinic initially 203 lbs, 199 lbs without shoes.  Per review weight stable.  Was 205 in December 2024.  OFV 07/14/2023  weight 204 lbs.  Obtain labs.  Colonoscopy done 05/02/2023.  Continue to monitor.  Consider low-dose CT scan lung cancer screening.  Continue Dupixent for psoriasis.  Dupixent can cause diarrhea in 3-4% of adults, UTIs in 3%, cholecystitis in less than 1%.  Continue f/u with Derm.    Return if symptoms worsen or fail to improve.   Viola Greulich, MD

## 2023-10-03 ENCOUNTER — Ambulatory Visit: Payer: Self-pay | Admitting: Family Medicine

## 2023-10-16 ENCOUNTER — Ambulatory Visit: Payer: Self-pay

## 2023-10-16 NOTE — Telephone Encounter (Signed)
 FYI Only or Action Required?: Action required by provider  Patient was last seen in primary care on 10/02/2023 by Viola Greulich, MD. Called Nurse Triage reporting back pain that has spread to left pain. Symptoms began 10/02/23. Interventions attempted: OTC medications: Tylenol and Ibuprofen. Symptoms are: gradually worsening.  Triage Disposition: See PCP When Office is Open (Within 3 Days) - before scheduling appt, patient wants to know if Dr. Arliss Lam has further instructions or suggestions since he was seen for this same issue. Please advise.  Patient/caregiver understands and will follow disposition?: Yes       Copied from CRM [id number]. Topic: Clinical - Red Word Triage >> Oct 16, 2023  4:27 PM Magdalene School wrote: Red Word that prompted transfer to Nurse Triage: patient stated that the discomfort and pain he was having during his last appointment on 10/03/23 has moved and shifted a little bit to left side and to his back. Reason for Disposition  [1] MODERATE back pain (e.g., interferes with normal activities) AND [2] present > 3 days  Answer Assessment - Initial Assessment Questions 1. ONSET: "When did the pain begin?"      Right side flank pain ongoing since appt on 05/22     Left side flank pain started about a week ago 2. LOCATION: "Where does it hurt?" (upper, mid or lower back)     Primarily on the right side - but patient states that he told PCP he hasn't felt it move. However; now he is feeling it on the left side 3. SEVERITY: "How bad is the pain?"  (e.g., Scale 1-10; mild, moderate, or severe)   - MILD (1-3): Doesn't interfere with normal activities.    - MODERATE (4-7): Interferes with normal activities or awakens from sleep.    - SEVERE (8-10): Excruciating pain, unable to do any normal activities.      Right - last night it flared up to 7     Left - 2 or 3 4. PATTERN: "Is the pain constant?" (e.g., yes, no; constant, intermittent)      Comes and goes 5. RADIATION: "Does the  pain shoot into your legs or somewhere else?"     No 6. CAUSE:  "What do you think is causing the back pain?"      Unsure - please review recent OV notes 7. BACK OVERUSE:  "Any recent lifting of heavy objects, strenuous work or exercise?"     Patient states he is unsure if he overexerted himself but did work in the yard last week.  8. MEDICINES: "What have you taken so far for the pain?" (e.g., nothing, acetaminophen, NSAIDS)     Tylenol, ibuprofen 9. NEUROLOGIC SYMPTOMS: "Do you have any weakness, numbness, or problems with bowel/bladder control?"     No 10. OTHER SYMPTOMS: "Do you have any other symptoms?" (e.g., fever, abdomen pain, burning with urination, blood in urine)       Patient states he has had abdominal discomfort (pressure and upset stomach) in last few days, some diarrhea  Denies changes in urination, vomiting Patient states he felt the pain move to left side last week and then it has rotated back to right side.  Protocols used: Back Pain-A-AH

## 2023-10-20 NOTE — Telephone Encounter (Signed)
 Can place order for CT abdomen and pelvis given continued symptoms as labs were normal.

## 2023-10-20 NOTE — Telephone Encounter (Signed)
 Called and spoke with the patient and patient would like to wait to see if he needs the CT,  Will call back if need be

## 2023-12-18 ENCOUNTER — Other Ambulatory Visit: Payer: Self-pay | Admitting: Family Medicine

## 2023-12-18 DIAGNOSIS — F419 Anxiety disorder, unspecified: Secondary | ICD-10-CM

## 2023-12-18 NOTE — Telephone Encounter (Signed)
 Copied from CRM #[id number]. Topic: Clinical - Medication Refill >> Dec 18, 2023  2:43 PM Leah C wrote: Medication: ALPRAZolam  (XANAX ) 0.5 MG tablet  Has the patient contacted their pharmacy? No (Agent: If no, request that the patient contact the pharmacy for the refill. If patient does not wish to contact the pharmacy document the reason why and proceed with request.) (Agent: If yes, when and what did the pharmacy advise?)  This is the patient's preferred pharmacy:  [id number] - [place], [place] - [address] AT [facility] [address] [place] [address] Phone: [phone] Fax: [phone]  Is this the correct pharmacy for this prescription? Yes If no, delete pharmacy and type the correct one.   Has the prescription been filled recently? Yes  Is the patient out of the medication? No  Has the patient been seen for an appointment in the last year OR does the patient have an upcoming appointment? Yes, Sept 3rd   Can we respond through MyChart? yes  Agent: Please be advised that Rx refills may take up to 3 business days. We ask that you follow-up with your pharmacy.

## 2023-12-19 MED ORDER — ALPRAZOLAM 0.5 MG PO TABS: 0.5000 mg | ORAL_TABLET | Freq: Two times a day (BID) | ORAL | 1 refills | Status: DC | PRN

## 2024-01-14 ENCOUNTER — Encounter: Admitting: Family Medicine

## 2024-01-21 ENCOUNTER — Telehealth (INDEPENDENT_AMBULATORY_CARE_PROVIDER_SITE_OTHER): Admitting: Family Medicine

## 2024-01-21 ENCOUNTER — Encounter: Payer: Self-pay | Admitting: Family Medicine

## 2024-01-21 DIAGNOSIS — U071 COVID-19: Secondary | ICD-10-CM | POA: Diagnosis not present

## 2024-01-21 MED ORDER — NIRMATRELVIR/RITONAVIR (PAXLOVID)TABLET: 3.0000 | ORAL_TABLET | Freq: Two times a day (BID) | ORAL | 0 refills | Status: AC

## 2024-01-21 NOTE — Progress Notes (Signed)
 Patient ID: Roger Fowler, male   DOB: [birth year], 58 y.o.   MRN: [id number]   Virtual Visit via Video Note  I connected with Roger Fowler on 01/21/24 at  5:15 PM EDT by a video enabled telemedicine application and verified that I am speaking with the correct person using two identifiers.  Location patient: home Location provider:work or home office Persons participating in the virtual visit: patient, provider  I discussed the limitations of evaluation and management by telemedicine and the availability of in person appointments. The patient expressed understanding and agreed to proceed.   HPI:  Roger Fowler set up virtual to discuss acute illness.  He states last week one of his coworkers was ill but is not sure exactly what they had.  Early Monday he noticed some headache, body aches, nasal congestion, and cough.  He also had some chills but no documented fever.  Denies any nausea, vomiting, or diarrhea.  No dyspnea.  Has not done home COVID test yet but does have 1 at home and had thought about testing.  Generally healthy.  No chronic heart or lung problems.  ROS: See pertinent positives and negatives per HPI.  Past Medical History:  Diagnosis Date   Arthritis    Eczema    Hyperlipidemia    Psoriasis    Tobacco use     History reviewed. No pertinent surgical history.  Family History  Problem Relation Age of Onset   Hypertension Mother    Cancer Mother    Hypertension Brother    Diabetes Mellitus II Neg Hx    Colon cancer Neg Hx    Prostate cancer Neg Hx    Colon polyps Neg Hx    Rectal cancer Neg Hx     SOCIAL HX: History of nicotine use   Current Outpatient Medications:    ALPRAZolam  (XANAX ) 0.5 MG tablet, Take 1 tablet (0.5 mg total) by mouth 2 (two) times daily as needed for anxiety., Disp: 60 tablet, Rfl: 1   doxepin (SINEQUAN) 25 MG capsule, Take 25-50 mg by mouth at bedtime., Disp: , Rfl:    fluticasone  (FLONASE ) 50 MCG/ACT nasal spray, Place 2 sprays into both  nostrils daily., Disp: 16 g, Rfl: 6   gentamicin  ointment (GARAMYCIN ) 0.1 %, Apply 1 Application topically daily., Disp: 15 g, Rfl: 0   ipratropium (ATROVENT) 0.03 % nasal spray, Place 2 sprays into both nostrils 3 (three) times daily., Disp: , Rfl:    Naftifine  HCl (NAFTIN ) 2 % GEL, Apply 1 Application topically daily., Disp: 60 g, Rfl: 1   pravastatin  (PRAVACHOL ) 80 MG tablet, Take 1 tablet (80 mg total) by mouth daily., Disp: 90 tablet, Rfl: 3  EXAM:  VITALS per patient if applicable:  GENERAL: alert, oriented, appears well and in no acute distress  HEENT: atraumatic, conjunttiva clear, no obvious abnormalities on inspection of external nose and ears  NECK: normal movements of the head and neck  LUNGS: on inspection no signs of respiratory distress, breathing rate appears normal, no obvious gross SOB, gasping or wheezing  CV: no obvious cyanosis  MS: moves all visible extremities without noticeable abnormality  PSYCH/NEURO: pleasant and cooperative, no obvious depression or anxiety, speech and thought processing grossly intact  ASSESSMENT AND PLAN:  Discussed the following assessment and plan:  Acute upper respiratory symptoms.  Onset 2 days ago.  We recommend patient proceed with home COVID testing.  Will call back in about 20 minutes to get those results and go from there  Called patient  back.  He does have positive COVID by home test.  We discussed antivirals and he would like to try Paxlovid  3 capsules twice daily for 5 days.  His last GFR was 89.  Prescription sent.  Plenty fluids and rest.  Follow-up for any persistent or worsening symptoms.     I discussed the assessment and treatment plan with the patient. The patient was provided an opportunity to ask questions and all were answered. The patient agreed with the plan and demonstrated an understanding of the instructions.   The patient was advised to call back or seek an in-person evaluation if the symptoms worsen or if  the condition fails to improve as anticipated.     Wolm Scarlet, MD

## 2024-02-10 ENCOUNTER — Ambulatory Visit: Payer: Self-pay

## 2024-02-10 NOTE — Telephone Encounter (Signed)
 FYI Only or Action Required?: FYI only for provider.  Patient was last seen in primary care on 01/21/2024 by Micheal Wolm ORN, MD.  Called Nurse Triage reporting Shortness of Breath.  Symptoms began 2 to 3 days.  Interventions attempted: Nothing.  Symptoms are: unchanged.  Triage Disposition: See PCP When Office is Open (Within 3 Days)  Patient/caregiver understands and will follow disposition?: Yes    Copied from CRM [id number]. Topic: Clinical - Red Word Triage >> Feb 10, 2024  1:47 PM Berneda FALCON wrote: Red Word that prompted transfer to Nurse Triage: Patient states that he noticed a knot in his upper chest area last week, and thinks he has a hernia. Also states he has shortness of breath comes and goes along with chest tightness. Reason for Disposition  [1] Small swelling or lump AND [2] unexplained AND [3] present > 1 week  [1] MODERATE longstanding difficulty breathing (e.g., speaks in phrases, SOB even at rest, pulse 100-120) AND [2] SAME as normal  Answer Assessment - Initial Assessment Questions 1. RESPIRATORY STATUS: Describe your breathing? (e.g., wheezing, shortness of breath, unable to speak, severe coughing)      SOB comes and goes: more so with over exertion 2. ONSET: When did this breathing problem begin?      X 2 to 3 day 3. PATTERN Does the difficult breathing come and go, or has it been constant since it started?      Comes and goes 4. SEVERITY: How bad is your breathing? (e.g., mild, moderate, severe)      mild 5. RECURRENT SYMPTOM: Have you had difficulty breathing before? If Yes, ask: When was the last time? and What happened that time?      Yes but not this bad 6. CARDIAC HISTORY: Do you have any history of heart disease? (e.g., heart attack, angina, bypass surgery, angioplasty)      no 7. LUNG HISTORY: Do you have any history of lung disease?  (e.g., pulmonary embolus, asthma, emphysema)     no 8. CAUSE: What do you think is causing the  breathing problem?      unknown 9. OTHER SYMPTOMS: Do you have any other symptoms? (e.g., chest pain, cough, dizziness, fever, runny nose)     Tightness, wheezing,  10. O2 SATURATION MONITOR:  Do you use an oxygen saturation monitor (pulse oximeter) at home? If Yes, ask: What is your reading (oxygen level) today? What is your usual oxygen saturation reading? (e.g., 95%)       N/a 11. PREGNANCY: Is there any chance you are pregnant? When was your last menstrual period?       no 12. TRAVEL: Have you traveled out of the country in the last month? (e.g., travel history, exposures)       na  Had COVID about 3 weeks  Answer Assessment - Initial Assessment Questions 1. APPEARANCE of SWELLING: What does it look like?     no 2. SIZE: How large is the swelling? (e.g., inches, cm; or compare to size of pinhead, tip of pen, eraser, coin, pea, grape, ping pong ball)      nickel 3. LOCATION: Where is the swelling located?     1/2 inch lower than breast/chest area in the center 4. ONSET: When did the swelling start?     X week 5. COLOR: What color is it? Is there more than one color?     Skin tone 6. PAIN: Is there any pain? If Yes, ask: How bad is the pain? (  Scale 1-10; or mild, moderate, severe)       no 7. ITCH: Does it itch? If Yes, ask: How bad is the itch?      no 8. CAUSE: What do you think caused the swelling?     Possible hernia 9 OTHER SYMPTOMS: Do you have any other symptoms? (e.g., fever)     Na  Pt is requesting a chest xray on lump in chest  Protocols used: Breathing Difficulty-A-AH, Skin Lump or Localized Swelling-A-AH

## 2024-02-11 NOTE — Telephone Encounter (Signed)
Patient has appt 10/3

## 2024-02-13 ENCOUNTER — Ambulatory Visit: Payer: Self-pay

## 2024-02-13 ENCOUNTER — Encounter: Payer: Self-pay | Admitting: Family Medicine

## 2024-02-13 ENCOUNTER — Ambulatory Visit (INDEPENDENT_AMBULATORY_CARE_PROVIDER_SITE_OTHER)

## 2024-02-13 ENCOUNTER — Ambulatory Visit (INDEPENDENT_AMBULATORY_CARE_PROVIDER_SITE_OTHER): Admitting: Family Medicine

## 2024-02-13 VITALS — BP 126/80 | HR 82 | Temp 97.7°F | Ht 71.0 in | Wt 195.2 lb

## 2024-02-13 DIAGNOSIS — Z720 Tobacco use: Secondary | ICD-10-CM | POA: Diagnosis not present

## 2024-02-13 DIAGNOSIS — H6991 Unspecified Eustachian tube disorder, right ear: Secondary | ICD-10-CM

## 2024-02-13 DIAGNOSIS — R1013 Epigastric pain: Secondary | ICD-10-CM | POA: Diagnosis not present

## 2024-02-13 DIAGNOSIS — L409 Psoriasis, unspecified: Secondary | ICD-10-CM

## 2024-02-13 DIAGNOSIS — F419 Anxiety disorder, unspecified: Secondary | ICD-10-CM

## 2024-02-13 DIAGNOSIS — R1319 Other dysphagia: Secondary | ICD-10-CM

## 2024-02-13 DIAGNOSIS — Z125 Encounter for screening for malignant neoplasm of prostate: Secondary | ICD-10-CM | POA: Diagnosis not present

## 2024-02-13 DIAGNOSIS — E782 Mixed hyperlipidemia: Secondary | ICD-10-CM

## 2024-02-13 DIAGNOSIS — Z0000 Encounter for general adult medical examination without abnormal findings: Secondary | ICD-10-CM

## 2024-02-13 MED ORDER — FLUTICASONE PROPIONATE 50 MCG/ACT NA SUSP
2.0000 | Freq: Every day | NASAL | 6 refills | Status: AC
Start: 2024-02-13 — End: ?

## 2024-02-13 MED ORDER — ALPRAZOLAM 0.5 MG PO TABS: 0.5000 mg | ORAL_TABLET | Freq: Two times a day (BID) | ORAL | 1 refills | Status: AC | PRN

## 2024-02-13 MED ORDER — PANTOPRAZOLE SODIUM 20 MG PO TBEC: 20.0000 mg | DELAYED_RELEASE_TABLET | Freq: Every day | ORAL | 2 refills | Status: AC

## 2024-02-13 MED ORDER — DOXEPIN HCL 25 MG PO CAPS: 25.0000 mg | ORAL_CAPSULE | Freq: Every day | ORAL | 0 refills | Status: DC

## 2024-02-13 NOTE — Telephone Encounter (Signed)
 FYI Only or Action Required?: Action required by provider: Requesting an alternative medication.  Patient was last seen in primary care on 02/13/2024 by Mercer Clotilda SAUNDERS, MD.  Called Nurse Triage reporting Medication Reaction.  Symptoms are: gradually worsening.  Triage Disposition: Call PCP When Office is Open  Patient/caregiver understands and will follow disposition?: Yes     Copied from CRM [id number]. Topic: Clinical - Medication Question >> Feb 13, 2024  3:53 PM Alfonso ORN wrote: Reason for CRM: pt stopped taking chloestrol med all together for past month due to side effects of joint pain . pt wants to know if pcp can prescribe something else. Reason for Disposition  [1] Caller has NON-URGENT medicine question about med that PCP prescribed AND [2] triager unable to answer question  Answer Assessment - Initial Assessment Questions Patient requesting a replacement mediation due to side effects. He would like the medication sent to pharmacy below if possible and requesting a call back regarding this request.    [id number] - [place], [place] - [address] AT [facility] [address] [place], [place] [address] Phone: [phone]  Fax: [phone]     1. NAME of MEDICINE: What medicine(s) are you calling about?     pravastatin  (PRAVACHOL ) 80 MG tablet 2. QUESTION: What is your question? (e.g., double dose of medicine, side effect)     Patient reporting side effects of medication  3. PRESCRIBER: Who prescribed the medicine? Reason: if prescribed by specialist, call should be referred to that group.     PCP 4. SYMPTOMS: Do you have any symptoms? If Yes, ask: What symptoms are you having?  How bad are the symptoms (e.g., mild, moderate, severe)     Joint pain  Protocols used: Medication Question Call-A-AH

## 2024-02-13 NOTE — Progress Notes (Signed)
 Established Patient Office Visit   Subjective  Patient ID: Roger Fowler, male    DOB: 09/08/1965  Age: 58 y.o. MRN: [id number]  Chief Complaint  Patient presents with   Annual Exam    1 1/2 weeks chest pain with knot     Pt is a 58 yo male seen for CPE and other concerns.  Pt endorses feeling a knot in epigastric area x 1.5 wks while in shower.  States food does not feel like it goes down right.  Also having SOB and pain in epigastrica area and RUQ, intermittent nausea, maybe some bloating, belching, flatus, looser smaller caliber stools.  Patient denies overt heartburn or in association with pain and food.  Patient endorses stopping pravastatin  2/2 myalgias.  Tried taking medication a few times a week but still having symptoms.  Patient continues to smoke cigarettes.  Smoking about half a pack per day since age 25.  States not yet ready to quit.  Requesting refill on Xanax  for anxiety, doxepin for psoriasis, and Flonase  for tube dysfunction.   Patient Active Problem List   Diagnosis Date Noted   Back pain 09/09/2014   Neck pain 09/09/2014   Preventative health care 12/10/2013   Change in bowel habits 12/10/2013   TOBACCO USE 01/09/2010   PSORIASIS 06/12/2007   Past Medical History:  Diagnosis Date   Arthritis    Eczema    Hyperlipidemia    Psoriasis    Tobacco use    History reviewed. No pertinent surgical history. Social History   Tobacco Use   Smoking status: Every Day    Current packs/day: 1.00    Average packs/day: 1 pack/day for 27.0 years (27.0 ttl pk-yrs)    Types: Cigarettes   Smokeless tobacco: Never  Vaping Use   Vaping status: Never Used  Substance Use Topics   Alcohol use: Yes    Alcohol/week: 1.0 standard drink of alcohol    Types: 1 Cans of beer per week    Comment: 1 beer everyday   Drug use: No   Family History  Problem Relation Age of Onset   Hypertension Mother    Cancer Mother    Hypertension Brother    Diabetes Mellitus II Neg Hx     Colon cancer Neg Hx    Prostate cancer Neg Hx    Colon polyps Neg Hx    Rectal cancer Neg Hx    Allergies  Allergen Reactions   Latex Hives and Itching    ROS Negative unless stated above    Objective:     BP 126/80 (BP Location: Left Arm, Patient Position: Sitting, Cuff Size: Large)   Pulse 82   Temp 97.7 F (36.5 C) (Oral)   Ht 5' 11 (1.803 m)   Wt 195 lb 3.2 oz (88.5 kg)   SpO2 98%   BMI 27.22 kg/m  BP Readings from Last 3 Encounters:  02/13/24 126/80  10/02/23 (!) 142/80  07/14/23 (!) 142/74   Wt Readings from Last 3 Encounters:  02/13/24 195 lb 3.2 oz (88.5 kg)  10/02/23 203 lb (92.1 kg)  07/14/23 204 lb 9.6 oz (92.8 kg)      Physical Exam Constitutional:      Appearance: Normal appearance.  HENT:     Head: Normocephalic and atraumatic.     Right Ear: Tympanic membrane, ear canal and external ear normal.     Left Ear: Tympanic membrane, ear canal and external ear normal.     Nose: Nose normal.  Mouth/Throat:     Mouth: Mucous membranes are moist.     Pharynx: No oropharyngeal exudate or posterior oropharyngeal erythema.  Eyes:     General: No scleral icterus.    Extraocular Movements: Extraocular movements intact.     Conjunctiva/sclera: Conjunctivae normal.     Pupils: Pupils are equal, round, and reactive to light.  Neck:     Thyroid : No thyromegaly.     Vascular: No carotid bruit.  Cardiovascular:     Rate and Rhythm: Normal rate and regular rhythm.     Pulses: Normal pulses.     Heart sounds: Normal heart sounds. No murmur heard.    No friction rub.  Pulmonary:     Effort: Pulmonary effort is normal.     Breath sounds: Normal breath sounds. No wheezing, rhonchi or rales.  Abdominal:     General: Bowel sounds are normal. There is no distension.     Palpations: Abdomen is soft. There is no mass.     Tenderness: There is no abdominal tenderness. There is no guarding or rebound.     Hernia: No hernia is present.  Musculoskeletal:         General: No deformity. Normal range of motion.  Lymphadenopathy:     Cervical: No cervical adenopathy.  Skin:    General: Skin is warm and dry.     Findings: Lesion present.     Comments: Hyperpigmentation and psoriatic plaques on b/l hand and wrist.  Neurological:     General: No focal deficit present.     Mental Status: He is alert and oriented to person, place, and time.  Psychiatric:        Mood and Affect: Mood normal.        Thought Content: Thought content normal.        02/13/2024    3:12 PM 07/14/2023    4:42 PM 06/06/2023    2:26 PM  Depression screen PHQ 2/9  Decreased Interest 1 1 0  Down, Depressed, Hopeless 1 1 0  PHQ - 2 Score 2 2 0  Altered sleeping 2 2   Tired, decreased energy 1 1   Change in appetite 1 1   Feeling bad or failure about yourself  0 0   Trouble concentrating 1 0   Moving slowly or fidgety/restless 0 0   Suicidal thoughts 0 0   PHQ-9 Score 7 6   Difficult doing work/chores Not difficult at all Not difficult at all       02/13/2024    3:12 PM 07/14/2023    4:42 PM 06/06/2023    2:27 PM 01/10/2023    2:50 PM  GAD 7 : Generalized Anxiety Score  Nervous, Anxious, on Edge 1 0 0 2  Control/stop worrying 2 0 0 1  Worry too much - different things 2 0 0 1  Trouble relaxing 1 0 0 1  Restless 1 0 0 2  Easily annoyed or irritable 1 0 2 2  Afraid - awful might happen 2 0 2 0  Total GAD 7 Score 10 0 4 9  Anxiety Difficulty Not difficult at all Not difficult at all  Not difficult at all     No results found for any visits on 02/13/24.    Assessment & Plan:   Well adult exam -     CBC with Differential/Platelet; Future -     Comprehensive metabolic panel with GFR; Future -     Hemoglobin A1c; Future -  Lipid panel; Future -     TSH; Future -     T4, free; Future  Anxiety -     ALPRAZolam ; Take 1 tablet (0.5 mg total) by mouth 2 (two) times daily as needed for anxiety.  Dispense: 60 tablet; Refill: 1 -     T4, free;  Future  Dysfunction of right eustachian tube -     Fluticasone  Propionate; Place 2 sprays into both nostrils daily.  Dispense: 16 g; Refill: 6  Screening for prostate cancer -     PSA; Future  Tobacco use -     DG Chest 2 View; Future -     CT CHEST LUNG CANCER SCREENING LOW DOSE WO CONTRAST; Future  Epigastric pain -     CBC with Differential/Platelet; Future -     Comprehensive metabolic panel with GFR; Future -     Hemoglobin A1c; Future -     Lipid panel; Future -     DG Chest 2 View; Future -     Lipase; Future -     Pantoprazole  Sodium; Take 1 tablet (20 mg total) by mouth daily before breakfast.  Dispense: 30 tablet; Refill: 2 -     Ambulatory referral to Gastroenterology  Psoriasis -     Doxepin HCl; Take 1-2 capsules (25-50 mg total) by mouth at bedtime.  Dispense: 90 capsule; Refill: 0  Esophageal dysphagia -     Pantoprazole  Sodium; Take 1 tablet (20 mg total) by mouth daily before breakfast.  Dispense: 30 tablet; Refill: 2 -     Ambulatory referral to Gastroenterology  Mixed hyperlipidemia -     Rosuvastatin Calcium; Take 1 tablet (5 mg total) by mouth every other day.  Dispense: 45 tablet; Refill: 3  Age appropriate health screenings discussed.  Obtain labs.  Immunizations reviewed.  Patient declines at this time.  Colonoscopy done 05/02/2023.  PSA done 10/02/2023 and normal at 0.3.  Psoriasis improving.  PHQ 9 score 7, GAD 7 score 10 this visit.  Refills provided.  Smoking cessation counseling >3 mon, <10 min.  Offered quit aids.  Pt declines at this time.  Continue to assess at each visit.  Low dose CT lung Ca screening.  Pravastatin  d/c'd 2/2 myalgias.  Rosuvastatin 5 mg every other day.  Can take CoQ10 with med to seen if it helps reduce myalgias.  Discussed possible causes of epigastric pain including GERD, ulcer, hiatal hernia.  Obtain CXR.  Start Protonix .  Referral to GI for further evaluation.  Given strict precautions.  Return in about 6 weeks (around  03/26/2024).  Clotilda JONELLE Single, MD

## 2024-02-14 LAB — HEMOGLOBIN A1C
Hgb A1c MFr Bld: 5.6 % (ref <5.7)
Mean Plasma Glucose: 114 mg/dL
eAG (mmol/L): 6.3 mmol/L

## 2024-02-14 LAB — COMPREHENSIVE METABOLIC PANEL WITH GFR
AG Ratio: 1.6 (calc) (ref 1.0–2.5)
ALT: 25 U/L (ref 9–46)
AST: 28 U/L (ref 10–35)
Albumin: 4.5 g/dL (ref 3.6–5.1)
Alkaline phosphatase (APISO): 78 U/L (ref 35–144)
BUN: 12 mg/dL (ref 7–25)
CO2: 25 mmol/L (ref 20–32)
Calcium: 9.5 mg/dL (ref 8.6–10.3)
Chloride: 104 mmol/L (ref 98–110)
Creat: 0.98 mg/dL (ref 0.70–1.30)
Globulin: 2.9 g/dL (ref 1.9–3.7)
Glucose, Bld: 96 mg/dL (ref 65–99)
Potassium: 4.4 mmol/L (ref 3.5–5.3)
Sodium: 136 mmol/L (ref 135–146)
Total Bilirubin: 0.7 mg/dL (ref 0.2–1.2)
Total Protein: 7.4 g/dL (ref 6.1–8.1)
eGFR: 90 mL/min/1.73m2 (ref >=60)

## 2024-02-14 LAB — CBC WITH DIFFERENTIAL/PLATELET
Absolute Lymphocytes: 1656 {cells}/uL (ref 850–3900)
Absolute Monocytes: 720 {cells}/uL (ref 200–950)
Basophils Absolute: 40 {cells}/uL (ref 0–200)
Basophils Relative: 0.5 %
Eosinophils Absolute: 8 {cells}/uL — ABNORMAL LOW (ref 15–500)
Eosinophils Relative: 0.1 %
HCT: 47.2 % (ref 38.5–50.0)
Hemoglobin: 15.4 g/dL (ref 13.2–17.1)
MCH: 27.5 pg (ref 27.0–33.0)
MCHC: 32.6 g/dL (ref 32.0–36.0)
MCV: 84.1 fL (ref 80.0–100.0)
MPV: 10.3 fL (ref 7.5–12.5)
Monocytes Relative: 9 %
Neutro Abs: 5576 {cells}/uL (ref 1500–7800)
Neutrophils Relative %: 69.7 %
Platelets: 207 Thousand/uL (ref 140–400)
RBC: 5.61 Million/uL (ref 4.20–5.80)
RDW: 13.8 % (ref 11.0–15.0)
Total Lymphocyte: 20.7 %
WBC: 8 Thousand/uL (ref 3.8–10.8)

## 2024-02-14 LAB — TSH: TSH: 1.12 m[IU]/L (ref 0.40–4.50)

## 2024-02-14 LAB — LIPID PANEL
Cholesterol: 221 mg/dL — ABNORMAL HIGH (ref <200)
HDL: 73 mg/dL (ref >=40)
LDL Cholesterol (Calc): 134 mg/dL — ABNORMAL HIGH
Non-HDL Cholesterol (Calc): 148 mg/dL — ABNORMAL HIGH (ref <130)
Total CHOL/HDL Ratio: 3 (calc) (ref <5.0)
Triglycerides: 58 mg/dL (ref <150)

## 2024-02-14 LAB — LIPASE: Lipase: 82 U/L — ABNORMAL HIGH (ref 7–60)

## 2024-02-14 LAB — T4, FREE: Free T4: 1.1 ng/dL (ref 0.8–1.8)

## 2024-02-14 LAB — PSA: PSA: 0.38 ng/mL (ref <=4.00)

## 2024-02-18 ENCOUNTER — Telehealth: Payer: Self-pay

## 2024-02-18 NOTE — Telephone Encounter (Signed)
 Copied from CRM [id number]. Topic: Clinical - Medication Question >> Feb 18, 2024  3:48 PM Mesmerise C wrote: Reason for CRM: Patient inquiring if pravastatin  (PRAVACHOL ) 80 MG tablet has changed the dosage to a lower dosage to help his cholesterol levels patient would like a call back

## 2024-02-23 ENCOUNTER — Telehealth: Payer: Self-pay

## 2024-02-23 ENCOUNTER — Ambulatory Visit
Admission: RE | Admit: 2024-02-23 | Discharge: 2024-02-23 | Disposition: A | Source: Ambulatory Visit | Attending: Family Medicine

## 2024-02-23 DIAGNOSIS — Z720 Tobacco use: Secondary | ICD-10-CM

## 2024-02-23 NOTE — Telephone Encounter (Signed)
 Copied from CRM [id number]. Topic: Clinical - Medication Question >> Feb 23, 2024  3:10 PM Viola F wrote: Reason for CRM: Patient called to follow up on message from 02/18/24, he stopped taking the the Pravastatin  medication due to it causing joint pain - he wants to know if Dr. Mercer wants to lower the dosage of the Pravastatin  or switch to an alternative medication? Please call [phone] (M)

## 2024-02-24 ENCOUNTER — Ambulatory Visit: Payer: Self-pay | Admitting: Family Medicine

## 2024-03-01 ENCOUNTER — Telehealth: Payer: Self-pay

## 2024-03-01 NOTE — Telephone Encounter (Signed)
 Copied from CRM [id number]. Topic: Clinical - Medication Question >> Feb 26, 2024  3:48 PM Ashley R wrote: Reason for CRM: Patient calling back to follow up on message from 02/18/24, he stopped taking the the Pravastatin  medication due to it causing joint pain - he wants to know if Dr. Mercer wants to lower the dosage of the Pravastatin  or switch to an alternative medication.

## 2024-03-02 ENCOUNTER — Ambulatory Visit: Payer: Self-pay

## 2024-03-02 MED ORDER — ROSUVASTATIN CALCIUM 5 MG PO TABS: 5.0000 mg | ORAL_TABLET | ORAL | 3 refills | Status: AC

## 2024-03-02 NOTE — Telephone Encounter (Signed)
 FYI Only or Action Required?: FYI only for provider.  Patient was last seen in primary care on 02/13/2024 by Mercer Clotilda SAUNDERS, MD.  Called Nurse Triage reporting Results.  Symptoms began today.  Interventions attempted: Nothing.  Symptoms are: stable.  Triage Disposition: No disposition on file.  Patient/caregiver understands and will follow disposition?:   Copied from CRM [id number]. Topic: Clinical - Lab/Test Results >> Mar 02, 2024  4:24 PM Kevelyn M wrote: Reason for CRM: Patient is requesting lab results from his CT scan. CAL requested that we call NT. Answer Assessment - Initial Assessment Questions 1. REASON FOR CALL: What is the main reason for your call? or How can I best help you?  Patient called regarding imaging results for chest xray and CT scan, and GI referral.   This RN read Dr. Mercer recommendation's for chest xray only: No acute findings on chest xray.  Mild chronic changes such as hyperinflation from h/o tobacco use.  Informed patient that Dr. Mercer has not given an interpretation of CT scan at this time, once she does someone will notify him.  Provided patient with GI referral appt scheduling number.  Patient voiced no additional questions or concerns.  No triage.  Protocols used: Information Only Call - No Triage-A-AH

## 2024-03-02 NOTE — Telephone Encounter (Unsigned)
 Copied from CRM [id number]. Topic: Clinical - Prescription Issue >> Mar 02, 2024  4:27 PM Kevelyn M wrote: Reason for CRM: Patient calling about prior question about his Pravastatin . See below.  Patient calling back to follow up on message from 02/18/24, he stopped taking the the Pravastatin  medication due to it causing joint pain - he wants to know if Dr. Mercer wants to lower the dosage of the Pravastatin  or switch to an alternative medication.  Call back # [phone]

## 2024-03-02 NOTE — Telephone Encounter (Signed)
 E2c2 agent will let pt know md out of the office today and will be back tomorrow

## 2024-03-03 NOTE — Telephone Encounter (Signed)
 See result note.

## 2024-03-03 NOTE — Telephone Encounter (Signed)
 Addressed.

## 2024-03-03 NOTE — Telephone Encounter (Signed)
 Matter addressed

## 2024-03-08 NOTE — Telephone Encounter (Signed)
Matter previously addressed. 

## 2024-03-17 NOTE — Telephone Encounter (Signed)
Unable to reach patient letter has been mailed.

## 2024-05-04 ENCOUNTER — Encounter: Payer: Self-pay | Admitting: Family Medicine

## 2024-05-04 ENCOUNTER — Ambulatory Visit: Admitting: Family Medicine

## 2024-05-04 VITALS — BP 126/64 | HR 88 | Temp 98.0°F | Wt 204.5 lb

## 2024-05-04 DIAGNOSIS — L309 Dermatitis, unspecified: Secondary | ICD-10-CM

## 2024-05-04 MED ORDER — PREDNISONE 10 MG PO TABS: ORAL_TABLET | ORAL | 0 refills | Status: AC

## 2024-05-04 NOTE — Progress Notes (Signed)
" ° °  Established Patient Office Visit  Subjective   Patient ID: Roger Fowler, male    DOB: 1965-09-28  Age: 58 y.o. MRN: [id number]  Chief Complaint  Patient presents with   Eczema    HPI   Roger Fowler has longstanding history of severe eczema.  He sees dermatologist and was tried briefly on Dupixent but had multiple side effects.  Currently using clobetasol.  Involvement is mostly hands and feet.  He has had worsening eczema flareups in the past during the wintertime and current symptoms became much worse couple weeks ago.  Severe pruritus at times.  Minimal relief with topicals.  No fever.  He is having a little bit of weeping through the skin especially on his feet.  No foul odor.  Past Medical History:  Diagnosis Date   Arthritis    Eczema    Hyperlipidemia    Psoriasis    Tobacco use    History reviewed. No pertinent surgical history.  reports that he has been smoking cigarettes. He has a 27 pack-year smoking history. He has never used smokeless tobacco. He reports current alcohol use of about 1.0 standard drink of alcohol per week. He reports that he does not use drugs. family history includes Cancer in his mother; Hypertension in his brother and mother. Allergies[1]  Review of Systems  Constitutional:  Negative for chills and fever.  Skin:  Positive for itching and rash.      Objective:     BP 126/64   Pulse 88   Temp 98 F (36.7 C) (Oral)   Wt 204 lb 8 oz (92.8 kg)   SpO2 97%   BMI 28.52 kg/m    Physical Exam Vitals reviewed.  Constitutional:      General: He is not in acute distress.    Appearance: He is not ill-appearing.  Cardiovascular:     Rate and Rhythm: Normal rate and regular rhythm.  Skin:    Findings: Rash present.     Comments: He has severe eczematous rash on both feet as well as lower legs and some involvement of both hands as well.  No pustular changes.  No cellulitis changes.  Neurological:     Mental Status: He is alert.      No  results found for any visits on 05/04/24.    The 10-year ASCVD risk score (Arnett DK, et al., 2019) is: 11%    Assessment & Plan:   Severe eczema with mostly hand foot and leg involvement.  Previous intolerance with Dupixent.  Currently using topical steroid with clobetasol.  Symptoms are fairly severe at this time.  We discussed short-term prednisone  taper until he can get back into see his dermatologist again.  Continue to do things to limit drying including limited bathing time and limited hot water exposure.  Also discussed gentle soap such as Dove.  Continue liberal use of moisturizers after bathing  Roger Scarlet, MD     [1]  Allergies Allergen Reactions   Latex Hives and Itching   Pravastatin  Other (See Comments)    Myalgias even with a few x/wk dosing.   "

## 2024-05-11 ENCOUNTER — Other Ambulatory Visit: Payer: Self-pay | Admitting: Family Medicine

## 2024-05-11 DIAGNOSIS — L409 Psoriasis, unspecified: Secondary | ICD-10-CM

## 2024-06-07 ENCOUNTER — Ambulatory Visit: Admitting: Gastroenterology

## 2024-06-10 ENCOUNTER — Other Ambulatory Visit: Payer: Self-pay | Admitting: Family Medicine

## 2024-06-10 DIAGNOSIS — L409 Psoriasis, unspecified: Secondary | ICD-10-CM

## 2024-06-28 ENCOUNTER — Ambulatory Visit: Admitting: Gastroenterology
